# Patient Record
Sex: Female | Born: 1960 | Race: White | Hispanic: No | Smoking: Current every day smoker
Health system: Southern US, Community
[De-identification: ages and names within clinical notes are randomized; demographics above are authoritative.]

## PROBLEM LIST (undated history)

## (undated) DIAGNOSIS — M4800 Spinal stenosis, site unspecified: Secondary | ICD-10-CM

## (undated) DIAGNOSIS — M51369 Other intervertebral disc degeneration, lumbar region without mention of lumbar back pain or lower extremity pain: Secondary | ICD-10-CM

## (undated) DIAGNOSIS — I10 Essential (primary) hypertension: Secondary | ICD-10-CM

## (undated) DIAGNOSIS — J449 Chronic obstructive pulmonary disease, unspecified: Secondary | ICD-10-CM

## (undated) HISTORY — PX: ABDOMINAL HYSTERECTOMY: SHX81

---

## 2000-07-01 ENCOUNTER — Encounter: Payer: Self-pay | Admitting: Family Medicine

## 2000-07-01 ENCOUNTER — Ambulatory Visit (HOSPITAL_COMMUNITY): Admission: RE | Admit: 2000-07-01 | Discharge: 2000-07-01 | Payer: Self-pay | Admitting: Family Medicine

## 2000-07-08 ENCOUNTER — Ambulatory Visit (HOSPITAL_COMMUNITY): Admission: RE | Admit: 2000-07-08 | Discharge: 2000-07-08 | Payer: Self-pay | Admitting: Family Medicine

## 2000-07-08 ENCOUNTER — Encounter: Payer: Self-pay | Admitting: Family Medicine

## 2000-09-19 ENCOUNTER — Encounter: Payer: Self-pay | Admitting: Family Medicine

## 2000-09-19 ENCOUNTER — Ambulatory Visit (HOSPITAL_COMMUNITY): Admission: RE | Admit: 2000-09-19 | Discharge: 2000-09-19 | Payer: Self-pay | Admitting: Family Medicine

## 2002-08-31 ENCOUNTER — Encounter: Payer: Self-pay | Admitting: Family Medicine

## 2002-08-31 ENCOUNTER — Ambulatory Visit (HOSPITAL_COMMUNITY): Admission: RE | Admit: 2002-08-31 | Discharge: 2002-08-31 | Payer: Self-pay | Admitting: Family Medicine

## 2002-09-24 ENCOUNTER — Ambulatory Visit (HOSPITAL_COMMUNITY): Admission: RE | Admit: 2002-09-24 | Discharge: 2002-09-24 | Payer: Self-pay | Admitting: Family Medicine

## 2002-09-24 ENCOUNTER — Encounter: Payer: Self-pay | Admitting: Family Medicine

## 2004-08-07 ENCOUNTER — Ambulatory Visit (HOSPITAL_COMMUNITY): Admission: RE | Admit: 2004-08-07 | Discharge: 2004-08-07 | Payer: Self-pay | Admitting: Family Medicine

## 2004-08-07 ENCOUNTER — Ambulatory Visit: Payer: Self-pay | Admitting: Family Medicine

## 2004-09-14 ENCOUNTER — Ambulatory Visit: Payer: Self-pay | Admitting: Family Medicine

## 2005-01-23 ENCOUNTER — Ambulatory Visit: Payer: Self-pay | Admitting: Family Medicine

## 2005-01-24 ENCOUNTER — Ambulatory Visit (HOSPITAL_COMMUNITY): Admission: RE | Admit: 2005-01-24 | Discharge: 2005-01-24 | Payer: Self-pay | Admitting: Family Medicine

## 2005-03-28 ENCOUNTER — Ambulatory Visit: Payer: Self-pay | Admitting: Family Medicine

## 2005-09-06 ENCOUNTER — Ambulatory Visit: Payer: Self-pay | Admitting: Family Medicine

## 2005-09-24 ENCOUNTER — Ambulatory Visit: Payer: Self-pay | Admitting: Family Medicine

## 2005-09-24 ENCOUNTER — Other Ambulatory Visit: Admission: RE | Admit: 2005-09-24 | Discharge: 2005-09-24 | Payer: Self-pay | Admitting: Family Medicine

## 2006-09-16 ENCOUNTER — Ambulatory Visit: Payer: Self-pay | Admitting: Family Medicine

## 2006-12-20 ENCOUNTER — Ambulatory Visit: Payer: Self-pay | Admitting: Family Medicine

## 2007-02-11 ENCOUNTER — Ambulatory Visit: Payer: Self-pay | Admitting: Family Medicine

## 2007-06-12 ENCOUNTER — Encounter: Payer: Self-pay | Admitting: Family Medicine

## 2007-12-17 DIAGNOSIS — F17200 Nicotine dependence, unspecified, uncomplicated: Secondary | ICD-10-CM | POA: Insufficient documentation

## 2007-12-17 DIAGNOSIS — J45909 Unspecified asthma, uncomplicated: Secondary | ICD-10-CM | POA: Insufficient documentation

## 2007-12-17 DIAGNOSIS — E669 Obesity, unspecified: Secondary | ICD-10-CM | POA: Insufficient documentation

## 2007-12-17 DIAGNOSIS — F329 Major depressive disorder, single episode, unspecified: Secondary | ICD-10-CM | POA: Insufficient documentation

## 2008-11-17 ENCOUNTER — Ambulatory Visit: Payer: Self-pay | Admitting: Family Medicine

## 2008-11-17 DIAGNOSIS — N920 Excessive and frequent menstruation with regular cycle: Secondary | ICD-10-CM

## 2008-11-24 DIAGNOSIS — G4700 Insomnia, unspecified: Secondary | ICD-10-CM

## 2008-11-26 ENCOUNTER — Telehealth (INDEPENDENT_AMBULATORY_CARE_PROVIDER_SITE_OTHER): Payer: Self-pay | Admitting: *Deleted

## 2008-11-30 ENCOUNTER — Ambulatory Visit (HOSPITAL_COMMUNITY): Admission: RE | Admit: 2008-11-30 | Discharge: 2008-11-30 | Payer: Self-pay | Admitting: Family Medicine

## 2009-06-21 ENCOUNTER — Encounter: Payer: Self-pay | Admitting: Family Medicine

## 2009-06-24 ENCOUNTER — Ambulatory Visit: Payer: Self-pay | Admitting: Family Medicine

## 2009-06-24 DIAGNOSIS — M549 Dorsalgia, unspecified: Secondary | ICD-10-CM | POA: Insufficient documentation

## 2009-06-24 DIAGNOSIS — N3000 Acute cystitis without hematuria: Secondary | ICD-10-CM

## 2009-06-24 DIAGNOSIS — R109 Unspecified abdominal pain: Secondary | ICD-10-CM

## 2009-06-24 LAB — CONVERTED CEMR LAB
Bilirubin Urine: NEGATIVE
Protein, U semiquant: NEGATIVE
Urobilinogen, UA: 0.2

## 2009-06-30 ENCOUNTER — Ambulatory Visit (HOSPITAL_COMMUNITY): Admission: RE | Admit: 2009-06-30 | Discharge: 2009-06-30 | Payer: Self-pay | Admitting: Family Medicine

## 2009-06-30 ENCOUNTER — Ambulatory Visit: Payer: Self-pay | Admitting: Family Medicine

## 2009-06-30 DIAGNOSIS — I2510 Atherosclerotic heart disease of native coronary artery without angina pectoris: Secondary | ICD-10-CM | POA: Insufficient documentation

## 2009-06-30 DIAGNOSIS — IMO0002 Reserved for concepts with insufficient information to code with codable children: Secondary | ICD-10-CM

## 2009-06-30 DIAGNOSIS — R1084 Generalized abdominal pain: Secondary | ICD-10-CM | POA: Insufficient documentation

## 2009-07-04 ENCOUNTER — Ambulatory Visit (HOSPITAL_COMMUNITY): Admission: RE | Admit: 2009-07-04 | Discharge: 2009-07-04 | Payer: Self-pay | Admitting: Family Medicine

## 2009-07-11 ENCOUNTER — Encounter: Payer: Self-pay | Admitting: Family Medicine

## 2009-07-20 ENCOUNTER — Encounter (HOSPITAL_COMMUNITY): Admission: RE | Admit: 2009-07-20 | Discharge: 2009-08-19 | Payer: Self-pay | Admitting: Cardiology

## 2009-07-20 ENCOUNTER — Encounter (INDEPENDENT_AMBULATORY_CARE_PROVIDER_SITE_OTHER): Payer: Self-pay | Admitting: Cardiology

## 2009-07-26 ENCOUNTER — Encounter: Payer: Self-pay | Admitting: Family Medicine

## 2009-07-27 ENCOUNTER — Telehealth: Payer: Self-pay | Admitting: Family Medicine

## 2009-07-28 ENCOUNTER — Telehealth: Payer: Self-pay | Admitting: Family Medicine

## 2009-08-02 ENCOUNTER — Ambulatory Visit: Payer: Self-pay | Admitting: Family Medicine

## 2009-08-02 ENCOUNTER — Telehealth: Payer: Self-pay | Admitting: Family Medicine

## 2009-08-02 DIAGNOSIS — F411 Generalized anxiety disorder: Secondary | ICD-10-CM

## 2009-10-11 ENCOUNTER — Encounter: Payer: Self-pay | Admitting: Family Medicine

## 2010-04-23 ENCOUNTER — Emergency Department (HOSPITAL_COMMUNITY): Admission: EM | Admit: 2010-04-23 | Discharge: 2010-04-23 | Payer: Self-pay | Admitting: Emergency Medicine

## 2010-06-06 IMAGING — NM NM MYOCAR MULTI W/SPECT W/WALL MOTION & EF
2 series · 12 of 12 positions shown · non-contrast
Comparison: None.

CLINICAL DATA: Chest pain

MYOCARDIAL IMAGING WITH SPECT (REST AND EXERCISE)
GATED LEFT VENTRICULAR WALL MOTION STUDY
LEFT VENTRICULAR EJECTION FRACTION
TECHNIQUE: Standard myocardial SPECT imaging was performed after
resting intravenous injection of  10.38 mCi 6c-NNm Myoview.
Subsequently, exercise tolerance test was performed by the patient
under the supervision of the Cardiology staff.  At peak-stress,
31.9 mCi 6c-NNm Myoview was injected intravenously and standard
myocardial SPECT imaging was performed.  Quantitative gated imaging
was also performed to evaluate left ventricular wall motion, and
estimate left ventricular ejection fraction.

[Series 1: cs cardiac tc hi dose · 6.41mm/px · 6 of 512 frames shown]
[frame 43/512]
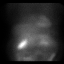
[frame 128/512]
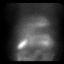
[frame 214/512]
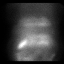
[frame 299/512]
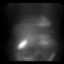
[frame 384/512]
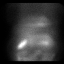
[frame 470/512]
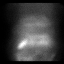

[Series 1: cr cardiac tc low dose · 6.41mm/px · 6 of 64 frames shown]
[frame 6/64]
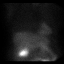
[frame 16/64]
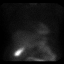
[frame 27/64]
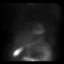
[frame 38/64]
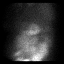
[frame 48/64]
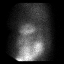
[frame 59/64]
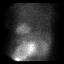

[12 of 12 positions shown; findings below may reference images not displayed]

FINDINGS: Myocardial perfusion SPECT images at stress demonstrate a subtle
perfusion defect at the anterior wall left ventricle at the apex.
Review of raw data acquisition images suggests this is a breast
attenuation artifact.
Resting exam unchanged.
Remaining myocardial perfusion appears normal.
No pulmonary uptake of tracer.

Normal left ventricular ejection fraction of 66% calculated on
gated SPECT images at stress.
This is derived from an end-diastolic volume calculation of 91 ml
and end-systolic volume of 31 ml.
Normal left ventricular wall motion identified on this gated SPECT
images at stress.
IMPRESSION: Probable breast attenuation artifact at the anterior wall left
ventricle.
No definite myocardial perfusion defects.
Normal left ventricular ejection fraction of 66%.
Normal LV wall motion.

## 2010-07-02 ENCOUNTER — Encounter: Payer: Self-pay | Admitting: Family Medicine

## 2010-07-03 ENCOUNTER — Encounter: Payer: Self-pay | Admitting: Family Medicine

## 2010-07-11 NOTE — Letter (Signed)
Summary: xray  xray   Imported By: Curtis Sites 11/04/2009 14:06:32  _____________________________________________________________________  External Attachment:    Type:   Image     Comment:   External Document

## 2010-07-11 NOTE — Letter (Signed)
Summary: misc.  misc.   Imported By: Curtis Sites 11/04/2009 14:05:01  _____________________________________________________________________  External Attachment:    Type:   Image     Comment:   External Document

## 2010-07-11 NOTE — Progress Notes (Signed)
Summary: RX  Phone Note Call from Patient   Summary of Call: SEND HER BUSPIRONE TO [facility] IN [place] Initial call taken by: Lind Guest,  August 02, 2009 1:48 PM  Follow-up for Phone Call        already did. It takes a bit to get through the system Follow-up by: Everitt Amber LPN,  August 02, 2009 1:50 PM

## 2010-07-11 NOTE — Progress Notes (Signed)
[facility]   Imported By: Lind Guest 07/14/2009 16:44:19  _____________________________________________________________________  External Attachment:    Type:   Image     Comment:   External Document

## 2010-07-11 NOTE — Progress Notes (Signed)
[facility]   Imported By: Lind Guest 10/14/2009 09:33:39  _____________________________________________________________________  External Attachment:    Type:   Image     Comment:   External Document

## 2010-07-11 NOTE — Letter (Signed)
Summary: consults  consults   Imported By: Curtis Sites 11/04/2009 14:03:14  _____________________________________________________________________  External Attachment:    Type:   Image     Comment:   External Document

## 2010-07-11 NOTE — Letter (Signed)
Summary: history and physical  history and physical   Imported By: Curtis Sites 11/04/2009 14:02:57  _____________________________________________________________________  External Attachment:    Type:   Image     Comment:   External Document

## 2010-07-11 NOTE — Letter (Signed)
Summary: labs  labs   Imported By: Curtis Sites 11/04/2009 14:04:44  _____________________________________________________________________  External Attachment:    Type:   Image     Comment:   External Document

## 2010-07-11 NOTE — Assessment & Plan Note (Signed)
Summary: office visit   Vital Signs:  Patient profile:   50 year old female Menstrual status:  irregular Height:      66 inches Weight:      248.75 pounds BMI:     40.29 O2 Sat:      97 % Pulse rate:   83 / minute Pulse rhythm:   regular Resp:     16 per minute BP sitting:   114 / 76  (right arm) Cuff size:   large  Vitals Entered By: Everitt Amber (June 30, 2009 2:05 PM)  Nutrition Counseling: Patient's BMI is greater than 25 and therefore counseled on weight management options. CC: Still having back pain, lastnight and this morning it was awful Pain Assessment Patient in pain? yes     Location: low back Intensity: 3 Type: aching Onset of pain  2 weeks   Primary Care Provider:  Syliva Overman MD  CC:  Still having back pain and lastnight and this morning it was awful.  History of Present Illness: Pt reprts that she is still having alot of pain cannot lie down at night, not sleeping.The pain is intermittent , more than constant, it radiates around the abdomen and pelvis at times. She has no other new complaints    Preventive Screening-Counseling & Management  Alcohol-Tobacco     Smoking Cessation Counseling: yes  Current Medications (verified): 1)  Lortab 10 10-500 Mg Tabs (Hydrocodone-Acetaminophen) .... One Tablet Evry 4 To 6 Hours As Needed 2)  Cyclobenzaprine Hcl 10 Mg Tabs (Cyclobenzaprine Hcl) .... Take 1 Tablet By Mouth Three Times A Day 3)  Septra Ds 800-160 Mg Tabs (Sulfamethoxazole-Trimethoprim) .... Take 1 Tablet By Mouth Two Times A Day 4)  Proventil Hfa 108 (90 Base) Mcg/act Aers (Albuterol Sulfate) .... 2 Puffs Every 6 To 8 Hours As Needed 5)  Symbicort 160-4.5 Mcg/act Aero (Budesonide-Formoterol Fumarate) .... 2 Puffs Twice Daily  Allergies (verified): 1)  ! Penicillin  Review of Systems      See HPI General:  Complains of sleep disorder; uncontrooled pain. ENT:  Denies hoarseness, nasal congestion, sinus pressure, and sore throat. CV:   Complains of palpitations; 2 month. Resp:  Denies cough and sputum productive. GI:  Complains of abdominal pain; denies constipation, diarrhea, nausea, and vomiting. GU:  Denies dysuria and urinary frequency. MS:  Complains of mid back pain; pain persits, primarily when she lies down.  Physical Exam  General:  Well-developed obese,in no cardiopulmonary distress; alert,appropriate and cooperative throughout examination. Pt in pain. HEENT: No facial asymmetry,  EOMI, No sinus tenderness, TM's Clear, oropharynx  pink and moist.   Chest:decreased air entry bilaterally scattrered wheezes, no crackles  CVS: S1, S2, No murmurs, No S3.   Abd: Soft, left renal angle tenderness. generalizedfabdominal tenderness, no guarding, rebound , organomegaly or masses palpable MS: decreased ROM thoracic spineparticularly with turnin g to the sides,adequate in  hips, shoulders and knees.  Ext: No edema.   CNS: CN 2-12 intact, power tone and sensation normal throughout.   Skin: Intact, no visible lesions or rashes.  Psych: Good eye contact, normal affect.  Memory intact,both anxious and depressed appearing.     Impression & Recommendations:  Problem # 1:  BACK PAIN WITH RADICULOPATHY (ICD-729.2) Assessment Unchanged  Orders: Ketorolac-Toradol 15mg  (Z6109) Depo- Medrol 80mg  (J1040) Admin of Therapeutic Inj  intramuscular or subcutaneous (60454) Radiology Referral (Radiology)  Problem # 2:  ABDOMINAL PAIN, GENERALIZED (ICD-789.07) Assessment: Deteriorated  Orders: Radiology Referral (Radiology)  Problem # 3:  PELVIC  PAIN (ICD-789.09) Assessment: Deteriorated  The following medications were removed from the medication list:    Cyclobenzaprine Hcl 10 Mg Tabs (Cyclobenzaprine hcl) ..... One tab three times a day    Lortab 10 10-500 Mg Tabs (Hydrocodone-acetaminophen) ..... One tab every 4-6 hrs as needed Her updated medication list for this problem includes:    Lortab 10 10-500 Mg Tabs  (Hydrocodone-acetaminophen) ..... One tablet evry 4 to 6 hours as needed    Cyclobenzaprine Hcl 10 Mg Tabs (Cyclobenzaprine hcl) .Marland Kitchen... Take 1 tablet by mouth three times a day  Orders: Radiology Referral (Radiology)  Problem # 4:  OBESITY (ICD-278.00) Assessment: Unchanged  Ht: 66 (06/30/2009)   Wt: 248.75 (06/30/2009)   BMI: 40.29 (06/30/2009)  Problem # 5:  ASTHMA, UNSPECIFIED, UNSPECIFIED STATUS (ICD-493.90) Assessment: Improved  Her updated medication list for this problem includes:    Proventil Hfa 108 (90 Base) Mcg/act Aers (Albuterol sulfate) .Marland Kitchen... 2 puffs every 6 to 8 hours as needed    Symbicort 160-4.5 Mcg/act Aero (Budesonide-formoterol fumarate) .Marland Kitchen... 2 puffs twice daily    Prednisone (pak) 5 Mg Tabs (Prednisone) ..... Uad x 6 days  Problem # 6:  NICOTINE ADDICTION (ICD-305.1) Assessment: Unchanged  Encouraged smoking cessation and discussed different methods for smoking cessation.   Complete Medication List: 1)  Lortab 10 10-500 Mg Tabs (Hydrocodone-acetaminophen) .... One tablet evry 4 to 6 hours as needed 2)  Cyclobenzaprine Hcl 10 Mg Tabs (Cyclobenzaprine hcl) .... Take 1 tablet by mouth three times a day 3)  Septra Ds 800-160 Mg Tabs (Sulfamethoxazole-trimethoprim) .... Take 1 tablet by mouth two times a day 4)  Proventil Hfa 108 (90 Base) Mcg/act Aers (Albuterol sulfate) .... 2 puffs every 6 to 8 hours as needed 5)  Symbicort 160-4.5 Mcg/act Aero (Budesonide-formoterol fumarate) .... 2 puffs twice daily 6)  Prednisone (pak) 5 Mg Tabs (Prednisone) .... Uad x 6 days  Other Orders: Cardiology Referral (Cardiology)  Patient Instructions: 1)  Please schedule a follow-up appointment in 1 month. 2)  Tobacco is very bad for your health and your loved ones! You Should stop smoking!. 3)  Stop Smoking Tips: Choose a Quit date. Cut down before the Quit date. decide what you will do as a substitute when you feel the urge to smoke(gum,toothpick,exercise). 4)  It is  important that you exercise regularly at least 20 minutes 5 times a week. If you develop chest pain, have severe difficulty breathing, or feel very tired , stop exercising immediately and seek medical attention. 5)  You need to lose weight. Consider a lower calorie diet and regular exercise.' 6)  You will be referred for MRI of thoracolumbar spine and abdominal and pelvic CT scasn with contrast to eval back pain, upper abd pain and re-eval ovasrian cyst 7)  You will be referred to cardiology Prescriptions: PREDNISONE (PAK) 5 MG TABS (PREDNISONE) UAD x 6 days  #21 x 0   Entered by:   Everitt Amber   Authorized by:   Syliva Overman MD   Signed by:   Syliva Overman MD on 07/11/2009   Method used:   Handwritten   RxID:   [id number]    Medication Administration  Injection # 1:    Medication: Depo- Medrol 80mg     Diagnosis: BACK PAIN WITH RADICULOPATHY (ICD-729.2)    Route: IM    Site: RUOQ gluteus    Exp Date: 10/12    Lot #: [id number]    Mfr: [employer]    Comments: 80 mg given   Injection #  2:    Medication: Ketorolac-Toradol 15mg     Diagnosis: BACK PAIN WITH RADICULOPATHY (ICD-729.2)    Route: IM    Site: LUOQ gluteus    Exp Date: 01/2011    Lot #: [id number]    Mfr: [email or web address]    Comments: 60 mg given     Patient tolerated injection without complications    Given by: Everitt Amber (June 30, 2009 3:29 PM)  Orders Added: 1)  Est. Patient Level III [99213] 2)  Ketorolac-Toradol 15mg  [J1885] 3)  Depo- Medrol 80mg  [J1040] 4)  Admin of Therapeutic Inj  intramuscular or subcutaneous [96372] 5)  Cardiology Referral [Cardiology] 6)  Radiology Referral [Radiology] 7)  Radiology Referral [Radiology]

## 2010-07-11 NOTE — Progress Notes (Signed)
[facility]   Imported By: Lind Guest 07/28/2009 14:37:00  _____________________________________________________________________  External Attachment:    Type:   Image     Comment:   External Document

## 2010-07-11 NOTE — Assessment & Plan Note (Signed)
Summary: office visit   Vital Signs:  Patient profile:   50 year old female Menstrual status:  irregular Height:      66 inches Weight:      242.75 pounds BMI:     39.32 O2 Sat:      96 % Pulse rate:   101 / minute Pulse rhythm:   regular Resp:     16 per minute BP sitting:   124 / 80 Cuff size:   large  Vitals Entered By: Everitt Amber LPN (August 02, 2009 12:55 PM)  Nutrition Counseling: Patient's BMI is greater than 25 and therefore counseled on weight management options. CC: Follow up chronic problems Is Patient Diabetic? No Pain Assessment Patient in pain? no        Primary Care Provider:  Syliva Overman MD  CC:  Follow up chronic problems.  History of Present Illness: Pt feels much better , she is reassured that she has no heart disease.  She continues to have significant stress as far as her marriage to an alcoholic  is concerned, and she Gina Robinson has unfortunately lot of drama and stress with 2 adult daughters who refuse to work. She  states she actually feels like running away from home now.she has no immediate urge to resme a relationship with her spouse.   Current Medications (verified): 1)  Aspir-Low 81 Mg Tbec (Aspirin) .... Take 1 Tablet By Mouth Once A Day 2)  Fish Oil 1000 Mg Caps (Omega-3 Fatty Acids) .... Take 1 Tablet By Mouth Once A Day  Allergies (verified): 1)  ! Penicillin  Review of Systems      See HPI General:  Complains of fatigue; denies chills and loss of appetite. Eyes:  Denies discharge. ENT:  Denies hoarseness, nasal congestion, and sinus pressure. CV:  Denies chest pain or discomfort, palpitations, and swelling of feet. Resp:  Complains of cough and shortness of breath; still smokes but trying to quit. GI:  Denies abdominal pain, constipation, diarrhea, nausea, and vomiting. GU:  Denies dysuria and urinary frequency. MS:  Complains of low back pain and mid back pain. Derm:  Denies rash. Neuro:  Denies headaches, seizures, and  sensation of room spinning. Psych:  Complains of anxiety, easily angered, easily tearful, irritability, mental problems, and panic attacks; denies suicidal thoughts/plans, thoughts of violence, and unusual visions or sounds. Endo:  Denies cold intolerance, excessive hunger, excessive thirst, excessive urination, heat intolerance, polyuria, and weight change. Heme:  Denies abnormal bruising and bleeding. Allergy:  Complains of seasonal allergies.  Physical Exam  General:  Well-developed,obese,in no acute distress; alert,appropriate and cooperative throughout examination HEENT: No facial asymmetry,  EOMI, No sinus tenderness, TM's Clear, oropharynx  pink and moist.   Chest: Clear to auscultation bilaterally. decreasedair entry throughout CVS: S1, S2, No murmurs, No S3.   Abd: Soft, Nontender.  MS: Adequate ROM spine, hips, shoulders and knees.  Ext: No edema.   CNS: CN 2-12 intact, power tone and sensation normal throughout.   Skin: Intact, no visible lesions or rashes.  Psych: Good eye contact, flat affect.  Memory intact,  depressed appearing.    Impression & Recommendations:  Problem # 1:  GENERALIZED ANXIETY DISORDER (ICD-300.02) Assessment Deteriorated  The following medications were removed from the medication list:    Buspirone Hcl 7.5 Mg Tabs (Buspirone hcl) .Marland Kitchen... Take 1 tablet by mouth two times a day Her updated medication list for this problem includes:    Buspar 5 Mg Tabs (Buspirone hcl) .Marland Kitchen... Take 1  tablet by mouth two times a day    Citalopram Hydrobromide 20 Mg Tabs (Citalopram hydrobromide) .Marland Kitchen... Take 1 tablet by mouth once a day  Problem # 2:  DEPRESSION (ICD-311) Assessment: Deteriorated  The following medications were removed from the medication list:    Buspirone Hcl 7.5 Mg Tabs (Buspirone hcl) .Marland Kitchen... Take 1 tablet by mouth two times a day Her updated medication list for this problem includes:    Buspar 5 Mg Tabs (Buspirone hcl) .Marland Kitchen... Take 1 tablet by mouth two  times a day    Citalopram Hydrobromide 20 Mg Tabs (Citalopram hydrobromide) .Marland Kitchen... Take 1 tablet by mouth once a day pt encouraged to join a support gp for families with alcohol addiction and drug addiction isssues , she has both  Problem # 3:  BACK PAIN (ICD-724.5) Assessment: Improved  The following medications were removed from the medication list:    Lortab 10 10-500 Mg Tabs (Hydrocodone-acetaminophen) ..... One tablet evry 4 to 6 hours as needed    Cyclobenzaprine Hcl 10 Mg Tabs (Cyclobenzaprine hcl) .Marland Kitchen... Take 1 tablet by mouth three times a day Her updated medication list for this problem includes:    Aspir-low 81 Mg Tbec (Aspirin) .Marland Kitchen... Take 1 tablet by mouth once a day  Problem # 4:  NICOTINE ADDICTION (ICD-305.1)  Problem # 5:  NICOTINE ADDICTION (ICD-305.1) Assessment: Unchanged  Encouraged smoking cessation and discussed different methods for smoking cessation. pt is actively wanting to and trying to quit  Complete Medication List: 1)  Aspir-low 81 Mg Tbec (Aspirin) .... Take 1 tablet by mouth once a day 2)  Fish Oil 1000 Mg Caps (Omega-3 fatty acids) .... Take 1 tablet by mouth once a day 3)  Buspar 5 Mg Tabs (Buspirone hcl) .... Take 1 tablet by mouth two times a day 4)  Citalopram Hydrobromide 20 Mg Tabs (Citalopram hydrobromide) .... Take 1 tablet by mouth once a day  Patient Instructions: 1)  CPE in 5 weeks 2)  It is important that you exercise regularly at least 20 minutes 5 times a week. If you develop chest pain, have severe difficulty breathing, or feel very tired , stop exercising immediately and seek medical attention. 3)  You need to lose weight. Consider a lower calorie diet and regular exercise.  Prescriptions: CITALOPRAM HYDROBROMIDE 20 MG TABS (CITALOPRAM HYDROBROMIDE) Take 1 tablet by mouth once a day  #30 x 2   Entered by:   Everitt Amber LPN   Authorized by:   Syliva Overman MD   Signed by:   Everitt Amber LPN on 16/03/9603   Method used:    Electronically to        [facility]  [address] (retail)       [address]       [place], [place]  [address]       Ph: [phone]       Fax: [phone]   RxID:   [id number] BUSPAR 5 MG TABS (BUSPIRONE HCL) Take 1 tablet by mouth two times a day  #60 x 2   Entered by:   Everitt Amber LPN   Authorized by:   Syliva Overman MD   Signed by:   Everitt Amber LPN on 04/07/2535   Method used:   Electronically to        [facility]  [address] (retail)       [address]       [place]  [place], [place]  [address]       Ph: [phone]       Fax: [phone]   RxID:   [id number] CITALOPRAM HYDROBROMIDE 20 MG TABS (CITALOPRAM HYDROBROMIDE) Take 1 tablet by mouth once a day  #30 x 2   Entered and Authorized by:   Syliva Overman MD   Signed by:   Syliva Overman MD on 08/02/2009   Method used:   Electronically to        [facility]  [address] (retail)       [address]       [place], [place]  [address]       Ph: [phone]       Fax: [phone]   RxID:   [id number] BUSPAR 5 MG TABS (BUSPIRONE HCL) Take 1 tablet by mouth two times a day  #60 x 2   Entered and Authorized by:   Syliva Overman MD   Signed by:   Syliva Overman MD on 08/02/2009   Method used:   Electronically to        [facility]  [address] (retail)       [address]       [place], [place]  [address]       Ph: [phone]       Fax: [phone]   RxID:   [id number] BUSPIRONE HCL 7.5 MG TABS (BUSPIRONE HCL) Take 1 tablet by mouth two times a day  #60 x 3   Entered and Authorized by:   Syliva Overman MD   Signed by:   Syliva Overman MD on 08/02/2009   Method used:   Electronically to        [phone][facility] (retail)       [address]       [place], [place]  [address]       Ph: [phone] or [phone]       Fax: [phone]   RxID:   [id number]

## 2010-07-11 NOTE — Progress Notes (Signed)
Summary: [facility]   Imported By: Lind Guest 06/24/2009 10:56:38  _____________________________________________________________________  External Attachment:    Type:   Image     Comment:   External Document

## 2010-07-11 NOTE — Assessment & Plan Note (Signed)
Summary: OV   Vital Signs:  Patient profile:   50 year old female Menstrual status:  irregular Height:      66 inches Weight:      251 pounds BMI:     40.66 O2 Sat:      96 % Pulse rate:   101 / minute Pulse rhythm:   regular Resp:     16 per minute BP sitting:   128 / 78 Cuff size:   large  Vitals Entered By: Everitt Amber (June 24, 2009 8:07 AM)  Nutrition Counseling: Patient's BMI is greater than 25 and therefore counseled on weight management options. CC: been having pain in the left side of her lower back since last friday Pain Assessment Patient in pain? yes     Location: left side of back  Intensity: 8 Type: pulsing Onset of pain  last friday   Primary Care Provider:  Syliva Overman MD  CC:  been having pain in the left side of her lower back since last friday.  History of Present Illness: Acute left flank and posterior lower chest pain (left) since  pasr 8 days. No specific aggravating factor noted.Pain is worse with twisting upper torso, breathing does not aggravate it specifically it is preventing her from rest. She had 2 previous MD encounters, one at a clinic , the other in the ed in [place], no imaging studies were done, pt states pain is 10 plus, she is on flexeril10mg  2 three times daily an hydrocodone 10/500 every 4 hours, had ben given tramadol  and diclofenac , chlorzoxazone with no relief States she was using initially 600mg  ibuprofen evry 3 to 4 hrs with tylenol with no relief , on the first day before she went to the clinic  She states her asthma  is about the same , currently sha has no inhalers, she has no insurance.   Preventive Screening-Counseling & Management  Alcohol-Tobacco     Smoking Status: current     Smoking Cessation Counseling: yes     Packs/Day: 1.0  Current Medications (verified): 1)  Cyclobenzaprine Hcl 10 Mg Tabs (Cyclobenzaprine Hcl) .... One Tab Three Times A Day 2)  Lortab 10 10-500 Mg Tabs (Hydrocodone-Acetaminophen)  .... One Tab Every 4-6 Hrs As Needed  Allergies (verified): 1)  ! Penicillin  Social History: Packs/Day:  1.0  Review of Systems General:  Complains of fatigue; denies chills and fever. ENT:  Denies nasal congestion, sinus pressure, and sore throat. CV:  Denies chest pain or discomfort, palpitations, and swelling of feet. Resp:  Complains of cough, shortness of breath, and wheezing; denies sputum productive; continues to smoke over 1PPD. GI:  Denies abdominal pain, constipation, diarrhea, nausea, and vomiting. GU:  Complains of urinary frequency; denies dysuria. MS:  See HPI; Denies joint pain and stiffness. Derm:  Denies itching and rash. Neuro:  Denies seizures and sensation of room spinning. Psych:  Complains of anxiety and depression. Endo:  Denies excessive thirst and excessive urination. Heme:  Denies abnormal bruising and bleeding. Allergy:  Complains of seasonal allergies; denies hives or rash and itching eyes.  Physical Exam  General:  Well-developed obese,in no cardiopulmonary distress; alert,appropriate and cooperative throughout examination. Pt in pain. HEENT: No facial asymmetry,  EOMI, No sinus tenderness, TM's Clear, oropharynx  pink and moist.   Chest:decreased air entry bilaterally scattrered wheezes, no crackles  CVS: S1, S2, No murmurs, No S3.   Abd: Soft, left renal angle tenderness.  MS: decreased ROM thoracic spineparticularly with turnin g  to the sides,adequate in  hips, shoulders and knees.  Ext: No edema.   CNS: CN 2-12 intact, power tone and sensation normal throughout.   Skin: Intact, no visible lesions or rashes.  Psych: Good eye contact, normal affect.  Memory intact,both anxious and depressed appearing.     Impression & Recommendations:  Problem # 1:  FLANK PAIN, LEFT (ICD-789.09) Assessment Deteriorated  Her updated medication list for this problem includes:    Cyclobenzaprine Hcl 10 Mg Tabs (Cyclobenzaprine hcl) ..... One tab three times a  day    Lortab 10 10-500 Mg Tabs (Hydrocodone-acetaminophen) ..... One tab every 4-6 hrs as needed    Lortab 10 10-500 Mg Tabs (Hydrocodone-acetaminophen) ..... One tablet evry 4 to 6 hours as needed    Cyclobenzaprine Hcl 10 Mg Tabs (Cyclobenzaprine hcl) .Marland Kitchen... Take 1 tablet by mouth three times a day  Orders: T-Creatinine Blood ([id number]) Radiology Referral (Radiology)  Problem # 2:  BACK PAIN (ICD-724.5) Assessment: Deteriorated  Her updated medication list for this problem includes:    Cyclobenzaprine Hcl 10 Mg Tabs (Cyclobenzaprine hcl) ..... One tab three times a day    Lortab 10 10-500 Mg Tabs (Hydrocodone-acetaminophen) ..... One tab every 4-6 hrs as needed    Lortab 10 10-500 Mg Tabs (Hydrocodone-acetaminophen) ..... One tablet evry 4 to 6 hours as needed    Cyclobenzaprine Hcl 10 Mg Tabs (Cyclobenzaprine hcl) .Marland Kitchen... Take 1 tablet by mouth three times a day  Orders: Radiology Referral (Radiology) Depo- Medrol 80mg  (J1040) Ketorolac-Toradol 15mg  (H0865) Admin of Therapeutic Inj  intramuscular or subcutaneous (78469)  Problem # 3:  OBESITY (ICD-278.00) Assessment: Unchanged  Ht: 66 (06/24/2009)   Wt: 251 (06/24/2009)   BMI: 40.66 (06/24/2009)  Problem # 4:  NICOTINE ADDICTION (ICD-305.1) Assessment: Unchanged  Encouraged smoking cessation and discussed different methods for smoking cessation.   Problem # 5:  ASTHMA, UNSPECIFIED, UNSPECIFIED STATUS (ICD-493.90) Assessment: Unchanged  Her updated medication list for this problem includes:    Proventil Hfa 108 (90 Base) Mcg/act Aers (Albuterol sulfate) .Marland Kitchen... 2 puffs every 6 to 8 hours as needed    Symbicort 160-4.5 Mcg/act Aero (Budesonide-formoterol fumarate) .Marland Kitchen... 2 puffs twice daily  Complete Medication List: 1)  Cyclobenzaprine Hcl 10 Mg Tabs (Cyclobenzaprine hcl) .... One tab three times a day 2)  Lortab 10 10-500 Mg Tabs (Hydrocodone-acetaminophen) .... One tab every 4-6 hrs as needed 3)  Lortab 10 10-500 Mg Tabs  (Hydrocodone-acetaminophen) .... One tablet evry 4 to 6 hours as needed 4)  Cyclobenzaprine Hcl 10 Mg Tabs (Cyclobenzaprine hcl) .... Take 1 tablet by mouth three times a day 5)  Septra Ds 800-160 Mg Tabs (Sulfamethoxazole-trimethoprim) .... Take 1 tablet by mouth two times a day 6)  Proventil Hfa 108 (90 Base) Mcg/act Aers (Albuterol sulfate) .... 2 puffs every 6 to 8 hours as needed 7)  Symbicort 160-4.5 Mcg/act Aero (Budesonide-formoterol fumarate) .... 2 puffs twice daily  Other Orders: UA Dipstick W/ Micro (manual) (62952)  Patient Instructions: 1)  Return for rept CCUA on completion of antibiotics in 1 week. 2)  CPE in 3 months. 3)  You need tests to see the caiuse of your pain, you eill be treated for presumed UTI to explain the blood in the urine . THIOs MUST bE RECHECKED 4)  Tobacco is very bad for your health and your loved ones! You Should stop smoking!. 5)  Stop Smoking Tips: Choose a Quit date. Cut down before the Quit date. decide what you will do as a substitute  when you feel the urge to smoke(gum,toothpick,exercise). 6)  It is important that you exercise regularly at least 20 minutes 5 times a week. If you develop chest pain, have severe difficulty breathing, or feel very tired , stop exercising immediately and seek medical attention. 7)  You need to lose weight. Consider a lower calorie diet and regular exercise.  Prescriptions: SYMBICORT 160-4.5 MCG/ACT AERO (BUDESONIDE-FORMOTEROL FUMARATE) 2 puffs twice daily  #2 x 0   Entered by:   Everitt Amber   Authorized by:   Syliva Overman MD   Signed by:   Syliva Overman MD on 06/24/2009   Method used:   Samples Given   RxID:   [id number] SYMBICORT 160-4.5 MCG/ACT AERO (BUDESONIDE-FORMOTEROL FUMARATE) 2 puffs twice daily  #1 x 3   Entered and Authorized by:   Syliva Overman MD   Signed by:   Syliva Overman MD on 06/24/2009   Method used:   Printed then faxed to ...       [facility] [address] [phone]* (retail)        [address] [place], [place]  [address]       Ph: [phone]       Fax: [phone]   RxID:   [id number] PROVENTIL HFA 108 (90 BASE) MCG/ACT AERS (ALBUTEROL SULFATE) 2 puffs every 6 to 8 hours as needed  #1 x 3   Entered and Authorized by:   Syliva Overman MD   Signed by:   Syliva Overman MD on 06/24/2009   Method used:   Printed then faxed to ...       [facility] [address] [phone]* (retail)       [address] [place], [place]  [address]       Ph: [phone]       Fax: [phone]   RxID:   [id number] SEPTRA DS 800-160 MG TABS (SULFAMETHOXAZOLE-TRIMETHOPRIM) Take 1 tablet by mouth two times a day  #20 x 0   Entered and Authorized by:   Syliva Overman MD   Signed by:   Syliva Overman MD on 06/24/2009   Method used:   Printed then faxed to ...       [facility] [address] [phone]* (retail)       [address] [place], [place]  [address]       Ph: [phone]       Fax: [phone]   RxID:   [id number] CYCLOBENZAPRINE HCL 10 MG TABS (CYCLOBENZAPRINE HCL) Take 1 tablet by mouth three times a day  #45 x 0   Entered and Authorized by:   Syliva Overman MD   Signed by:   Syliva Overman MD on 06/24/2009   Method used:   Printed then faxed to ...       [facility] [address] [phone]* (retail)       [address] [place], [place]  [address]       Ph: [phone]       Fax: [phone]   RxID:   [id number] LORTAB 10 10-500 MG TABS (HYDROCODONE-ACETAMINOPHEN) one tablet evry 4 to 6 hours as needed  #40 x 0   Entered and Authorized by:   Syliva Overman MD   Signed by:   Syliva Overman MD on 06/24/2009   Method used:   Printed then faxed to .Marland KitchenMarland Kitchen  [facility] [address] [phone]* (retail)       [address] [place], [place]  [address]       Ph: [phone]       Fax: [phone]   RxID:   [id number]   Laboratory Results   Urine Tests  Date/Time Received: June 24, 2009  Date/Time Reported: June 24, 2009    Routine Urinalysis   Color: lt. yellow Glucose: negative   (Normal Range: Negative) Bilirubin: negative   (Normal Range: Negative) Ketone: negative   (Normal Range: Negative) Spec. Gravity: >=1.030   (Normal Range: 1.003-1.035) Blood: small   (Normal Range: Negative) pH: 5.5   (Normal Range: 5.0-8.0) Protein: negative   (Normal Range: Negative) Urobilinogen: 0.2   (Normal Range: 0-1) Nitrite: negative   (Normal Range: Negative) Leukocyte Esterace: negative   (Normal Range: Negative)         Medication Administration  Injection # 1:    Medication: Depo- Medrol 80mg     Diagnosis: BACK PAIN (ICD-724.5)    Route: IM    Site: RUOQ gluteus    Exp Date: 12/2010    Lot #: [id number]    Mfr: Pharmacia    Comments: 80 mg given     Patient tolerated injection without complications    Given by: Everitt Amber (June 24, 2009 2:04 PM)  Injection # 2:    Medication: Ketorolac-Toradol 15mg     Diagnosis: BACK PAIN (ICD-724.5)    Route: IM    Site: LUOQ gluteus    Exp Date: 01/2011    Lot #: [id number]    Mfr: [email or web address]    Comments: 60 mg given     Patient tolerated injection without complications    Given by: Everitt Amber (June 24, 2009 2:05 PM)  Orders Added: 1)  Est. Patient Level III [99213] 2)  T-Creatinine Blood [44010-27253] 3)  Radiology Referral [Radiology] 4)  Radiology Referral [Radiology] 5)  UA Dipstick W/ Micro (manual) [81000] 6)  Depo- Medrol 80mg  [J1040] 7)  Ketorolac-Toradol 15mg  [J1885] 8)  Admin of Therapeutic Inj  intramuscular or subcutaneous [96372]   Appended Document: OV pls call pt and let her know it is vital she get the blood test o rdered asap so that she can have the CT scan they will need the creATININE, TO MAKE SURE HER KIDNEYS WORK WELL    Appended Document: OV patient aware

## 2010-07-11 NOTE — Letter (Signed)
Summary: rx assistance  rx assistance   Imported By: Lind Guest 06/24/2009 16:14:44  _____________________________________________________________________  External Attachment:    Type:   Image     Comment:   External Document

## 2010-07-11 NOTE — Progress Notes (Signed)
Summary: RX  Phone Note Call from Patient   Summary of Call: WENT TO THE HEART DR. 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  [facility]  CALL BACK AT [phone] TO LET HER KNOW Initial call taken by: Lind Guest,  July 27, 2009 1:36 PM  Follow-up for Phone Call        advise her to have the heart doc prescribere the med thios is ok Follow-up by: Syliva Overman MD,  July 27, 2009 4:14 PM  Additional Follow-up for Phone Call Additional follow up Details #1::        patient aware Additional Follow-up by: Adella Hare LPN,  July 27, 2009 4:19 PM     Appended Document: RX Her husband called and said that the heart doctor faxed [place] a letter stating that he was wanting her PCP to write the RX and wanted to know if you could send it to [facility] in [place]

## 2010-07-11 NOTE — Letter (Signed)
Summary: demographic  demographic   Imported By: Curtis Sites 11/04/2009 14:04:08  _____________________________________________________________________  External Attachment:    Type:   Image     Comment:   External Document

## 2010-07-11 NOTE — Letter (Signed)
Summary: phone notes  phone notes   Imported By: Curtis Sites 11/04/2009 14:05:20  _____________________________________________________________________  External Attachment:    Type:   Image     Comment:   External Document

## 2010-07-11 NOTE — Progress Notes (Signed)
Summary: rx  Phone Note Call from Patient   Summary of Call: pt calling about her medication. heart doc don't do anixety meds. [phone] Initial call taken by: Rudene Anda,  July 28, 2009 1:12 PM  Follow-up for Phone Call        needs an anxiety medication, the cardiologist will not prescribe this Follow-up by: Adella Hare LPN,  July 28, 2009 1:23 PM  Additional Follow-up for Phone Call Additional follow up Details #1::        The letter from the cardiologist states that she is to have another test done (event monitor) then they are going to see her back in 6 wks.  They should make a decision on the propranolol then. Additional Follow-up by: Esperanza Sheets PA,  July 28, 2009 4:04 PM    Additional Follow-up for Phone Call Additional follow up Details #2::    I agree pls let the pt know this Follow-up by: Syliva Overman MD,  July 28, 2009 5:14 PM  Additional Follow-up for Phone Call Additional follow up Details #3:: Details for Additional Follow-up Action Taken: husband aware Additional Follow-up by: Adella Hare LPN,  July 29, 2009 9:45 AM

## 2010-07-11 NOTE — Letter (Signed)
Summary: progress notes  progress notes   Imported By: Curtis Sites 11/04/2009 14:05:58  _____________________________________________________________________  External Attachment:    Type:   Image     Comment:   External Document

## 2010-08-27 LAB — CREATININE, SERUM: Creatinine, Ser: 0.64 mg/dL (ref 0.4–1.2)

## 2010-10-15 ENCOUNTER — Emergency Department (HOSPITAL_COMMUNITY)
Admission: EM | Admit: 2010-10-15 | Discharge: 2010-10-15 | Disposition: A | Discharge status: Home / Self Care | Payer: Self-pay | Attending: Emergency Medicine | Admitting: Emergency Medicine

## 2010-10-15 DIAGNOSIS — S058X9A Other injuries of unspecified eye and orbit, initial encounter: Secondary | ICD-10-CM | POA: Insufficient documentation

## 2010-10-15 DIAGNOSIS — J45909 Unspecified asthma, uncomplicated: Secondary | ICD-10-CM | POA: Insufficient documentation

## 2017-09-05 ENCOUNTER — Other Ambulatory Visit (HOSPITAL_COMMUNITY): Payer: Self-pay | Admitting: *Deleted

## 2017-09-05 DIAGNOSIS — R229 Localized swelling, mass and lump, unspecified: Principal | ICD-10-CM

## 2017-09-05 DIAGNOSIS — IMO0002 Reserved for concepts with insufficient information to code with codable children: Secondary | ICD-10-CM

## 2017-09-10 ENCOUNTER — Encounter (HOSPITAL_COMMUNITY): Payer: Self-pay

## 2017-09-24 ENCOUNTER — Ambulatory Visit (HOSPITAL_COMMUNITY)
Admission: RE | Admit: 2017-09-24 | Discharge: 2017-09-24 | Disposition: A | Discharge status: Home / Self Care | Payer: PRIVATE HEALTH INSURANCE | Source: Ambulatory Visit | Attending: *Deleted | Admitting: *Deleted

## 2017-09-24 ENCOUNTER — Encounter (HOSPITAL_COMMUNITY): Payer: Self-pay | Admitting: Radiology

## 2017-09-24 DIAGNOSIS — R921 Mammographic calcification found on diagnostic imaging of breast: Secondary | ICD-10-CM | POA: Diagnosis not present

## 2017-09-24 DIAGNOSIS — R229 Localized swelling, mass and lump, unspecified: Secondary | ICD-10-CM

## 2017-09-24 DIAGNOSIS — IMO0002 Reserved for concepts with insufficient information to code with codable children: Secondary | ICD-10-CM

## 2018-09-15 ENCOUNTER — Other Ambulatory Visit (HOSPITAL_COMMUNITY): Payer: Self-pay | Admitting: *Deleted

## 2018-09-15 DIAGNOSIS — R921 Mammographic calcification found on diagnostic imaging of breast: Secondary | ICD-10-CM

## 2018-09-15 DIAGNOSIS — R928 Other abnormal and inconclusive findings on diagnostic imaging of breast: Secondary | ICD-10-CM

## 2018-09-30 ENCOUNTER — Encounter (HOSPITAL_COMMUNITY): Payer: Self-pay

## 2018-09-30 ENCOUNTER — Ambulatory Visit (HOSPITAL_COMMUNITY)
Admission: RE | Admit: 2018-09-30 | Discharge: 2018-09-30 | Disposition: A | Discharge status: Home / Self Care | Payer: PRIVATE HEALTH INSURANCE | Source: Ambulatory Visit | Attending: *Deleted | Admitting: *Deleted

## 2018-09-30 ENCOUNTER — Ambulatory Visit (HOSPITAL_COMMUNITY): Admission: RE | Admit: 2018-09-30 | Payer: PRIVATE HEALTH INSURANCE | Source: Ambulatory Visit

## 2018-09-30 ENCOUNTER — Other Ambulatory Visit: Payer: Self-pay

## 2018-09-30 DIAGNOSIS — R921 Mammographic calcification found on diagnostic imaging of breast: Secondary | ICD-10-CM | POA: Insufficient documentation

## 2018-09-30 DIAGNOSIS — R928 Other abnormal and inconclusive findings on diagnostic imaging of breast: Secondary | ICD-10-CM

## 2019-06-30 ENCOUNTER — Ambulatory Visit (INDEPENDENT_AMBULATORY_CARE_PROVIDER_SITE_OTHER): Payer: 59

## 2019-06-30 ENCOUNTER — Ambulatory Visit
Admission: EM | Admit: 2019-06-30 | Discharge: 2019-06-30 | Disposition: A | Discharge status: Home / Self Care | Payer: 59

## 2019-06-30 DIAGNOSIS — S6992XA Unspecified injury of left wrist, hand and finger(s), initial encounter: Secondary | ICD-10-CM

## 2019-06-30 DIAGNOSIS — M79645 Pain in left finger(s): Secondary | ICD-10-CM

## 2019-06-30 NOTE — ED Provider Notes (Signed)
[place]   [id number] 06/30/19 Arrival Time: 0240  CC: LT finger PAIN  SUBJECTIVE: History from: patient. Gina Robinson is a 59 y.o. female complains of left little finger pain/ injury that began 3 days ago.  Slip and fall on wet floor injuring LT little finger.  Localizes the pain to the LT little finger.  Describes the pain as intermittent and throbbing in character.  Has tried OTC medications with minimal relief.  Symptoms are made worse with finger ROM.  Denies similar symptoms in the past.  Complains of associated swelling and bruising.  Denies fever, chills, erythema, weakness, numbness and tingling.  ROS: As per HPI.  All other pertinent ROS negative.     History reviewed. No pertinent past medical history. Past Surgical History:  Procedure Laterality Date  . ABDOMINAL HYSTERECTOMY     Allergies  Allergen Reactions  . Penicillins    No current facility-administered medications on file prior to encounter.   Current Outpatient Medications on File Prior to Encounter  Medication Sig Dispense Refill  . amLODipine (NORVASC) 5 MG tablet Take 5 mg by mouth daily.    Marland Kitchen gabapentin (NEURONTIN) 300 MG capsule     . hydrochlorothiazide (HYDRODIURIL) 25 MG tablet Take 25 mg by mouth daily.     Social History   Socioeconomic History  . Marital status: Married    Spouse name: Not on file  . Number of children: Not on file  . Years of education: Not on file  . Highest education level: Not on file  Occupational History  . Not on file  Tobacco Use  . Smoking status: Current Every Day Smoker    Packs/day: 1.00    Types: Cigarettes  . Smokeless tobacco: Never Used  Substance and Sexual Activity  . Alcohol use: Not Currently  . Drug use: Not Currently  . Sexual activity: Not on file  Other Topics Concern  . Not on file  Social History Narrative  . Not on file   Social Determinants of Health   Financial Resource Strain:   . Difficulty of Paying Living Expenses:  Not on file  Food Insecurity:   . Worried About Charity fundraiser in the Last Year: Not on file  . Ran Out of Food in the Last Year: Not on file  Transportation Needs:   . Lack of Transportation (Medical): Not on file  . Lack of Transportation (Non-Medical): Not on file  Physical Activity:   . Days of Exercise per Week: Not on file  . Minutes of Exercise per Session: Not on file  Stress:   . Feeling of Stress : Not on file  Social Connections:   . Frequency of Communication with Friends and Family: Not on file  . Frequency of Social Gatherings with Friends and Family: Not on file  . Attends Religious Services: Not on file  . Active Member of Clubs or Organizations: Not on file  . Attends Archivist Meetings: Not on file  . Marital Status: Not on file  Intimate Partner Violence:   . Fear of Current or Ex-Partner: Not on file  . Emotionally Abused: Not on file  . Physically Abused: Not on file  . Sexually Abused: Not on file   History reviewed. No pertinent family history.  OBJECTIVE:  Vitals:   06/30/19 1804  BP: 136/84  Pulse: 92  Resp: 18  Temp: 98.4 F (36.9 C)  SpO2: 94%    General appearance: ALERT; in no acute distress.  Head: NCAT Lungs: Normal respiratory effort CV: Radial pulse 2+ . Cap refill < 2 seconds Musculoskeletal: LT hand Inspection: Ecchymosis over plantar aspect of fifth digit Palpation: TTP over PIP joint and middle phalanx of fifth digit ROM: LROM about the finger Strength: 5/5 grip strength Skin: warm and dry Neurologic: Ambulates without difficulty; Sensation intact about the upper/ lower extremities Psychological: alert and cooperative; normal mood and affect  DIAGNOSTIC STUDIES:  DG Hand Complete Left  Result Date: 06/30/2019 CLINICAL DATA:  59 year old female with fall and trauma to the left hand. EXAM: LEFT HAND - COMPLETE 3+ VIEW COMPARISON:  None. FINDINGS: There is no acute fracture or dislocation. There is osteoarthritic  changes of the base of the thumb. The soft tissues are unremarkable. IMPRESSION: 1. No acute fracture or dislocation. 2. 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 Electronically Signed   By: Elgie Collard M.D.   On: 06/30/2019 18:26    X-rays negative for bony abnormalities including fracture, or dislocation.  No soft tissue swelling.    I have reviewed the x-rays myself and the radiologist interpretation. I am in agreement with the radiologist interpretation.     ASSESSMENT & PLAN:  1. Finger pain, left   2. Finger injury, left, initial encounter    X-ray negative for fracture or dislocation Continue conservative management of rest, ice, and elevation Buddy taped Use OTC ibuprofen and/or tylenol as needed for pain Follow up with PCP or orthopedist if symptoms persist Return or go to the ER if you have any new or worsening symptoms (fever, chills, chest pain, swelling, redness, bruising, worsening symptoms despite treatment, etc...)   Reviewed expectations re: course of current medical issues. Questions answered. Outlined signs and symptoms indicating need for more acute intervention. Patient verbalized understanding. After Visit Summary given.    Rennis Harding, PA-C 06/30/19 1835

## 2019-06-30 NOTE — ED Triage Notes (Signed)
Pt presents with injury to left 1st finger, pt slipped on wet floor on Saturday and injured finger, bruising and swelling noted

## 2019-06-30 NOTE — Discharge Instructions (Signed)
X-ray negative for fracture or dislocation Continue conservative management of rest, ice, and elevation Buddy taped Use OTC ibuprofen and/or tylenol as needed for pain Follow up with PCP or orthopedist if symptoms persist Return or go to the ER if you have any new or worsening symptoms (fever, chills, chest pain, swelling, redness, bruising, worsening symptoms despite treatment, etc...)

## 2019-09-15 ENCOUNTER — Encounter (HOSPITAL_COMMUNITY): Payer: Self-pay

## 2019-09-15 ENCOUNTER — Other Ambulatory Visit: Payer: Self-pay

## 2019-09-15 ENCOUNTER — Ambulatory Visit (HOSPITAL_COMMUNITY): Payer: 59 | Attending: Orthopedic Surgery

## 2019-09-15 DIAGNOSIS — M545 Low back pain, unspecified: Secondary | ICD-10-CM

## 2019-09-15 DIAGNOSIS — M6281 Muscle weakness (generalized): Secondary | ICD-10-CM | POA: Insufficient documentation

## 2019-09-15 DIAGNOSIS — G8929 Other chronic pain: Secondary | ICD-10-CM | POA: Diagnosis present

## 2019-09-15 DIAGNOSIS — R29898 Other symptoms and signs involving the musculoskeletal system: Secondary | ICD-10-CM | POA: Insufficient documentation

## 2019-09-15 NOTE — Therapy (Signed)
[facility] [address] [place], [place], [address] Phone: [phone]   Fax:  [phone]  Physical Therapy Evaluation  Patient Details  Name: Gina Robinson MRN: [id number] Date of Birth: [birth year] Referring Provider (PT): Venita Lick, MD   Encounter Date: 09/15/2019  PT End of Session - 09/15/19 0806    Visit Number  1    Number of Visits  12    Date for PT Re-Evaluation  10/30/19    Authorization Type  [employer]   FOTO every 5 visits   Authorization Time Period  09/15/19 to 10/30/19    Progress Note Due on Visit  12    PT Start Time  0815    PT Stop Time  0900    PT Time Calculation (min)  45 min    Activity Tolerance  Patient tolerated treatment well;Patient limited by pain    Behavior During Therapy  [facility] for tasks assessed/performed       History reviewed. No pertinent past medical history.  Past Surgical History:  Procedure Laterality Date  . ABDOMINAL HYSTERECTOMY      There were no vitals filed for this visit.   Subjective Assessment - 09/15/19 0819    Subjective  Pt reports LBP began ~4 years ago was diagnosed with spinal stenosis with 4-5 ruptured or herniated discs and that Dr Shon Baton confirmed that a few weeks ago. Pt reports by 1 o'clock back pain is so bad she is nauseas. Pt reports trying to walk daily. Pt reports pain 5/10 when she wakes, then it eases some and escalates by mid afternoon. Pt reports burning pain down front of R leg to knee, pain down to tailbone, muscle spasms mid to low back on L side. Pt reports numbness tingling down BLE, R>L down anterior legs. Pt denies leg buckling or weakness causing falls. Pt denies saddle parasthesias. Pt reports "close calls" with bowel/bladder control a 1-2 times in the last 3 months. Pt reports previous doctor told her that her spine was collapsing and suggesting putting a cage and rods in her back but she declined. Pt reports no shots for pain, only oral steroids  which didn't help. Pt reports frequent position changes throughout the day. Pt reports RLE "ache like a toothache" thorughout the night causing her to take "naps" all night. Pt reports sleeping in adjustable bed, has pillows propping her in all directs. Pt reports difficulty and increased pain with household chores. Pt reports stretching to improve pain and achiness.    Limitations  Sitting;Standing;Walking    How long can you sit comfortably?  10-30 minutes depending on time of day    How long can you stand comfortably?  10-45 minutes depending on time of day    How long can you walk comfortably?  10-45 minutes depending on time of day    Diagnostic tests  x-ray:    Patient Stated Goals  hopefully help with some of the pain I guess    Currently in Pain?  Yes    Pain Score  5     Pain Location  Back    Pain Orientation  Right;Lower    Pain Descriptors / Indicators  Burning   "hot poking knife in there"   Pain Type  Chronic pain    Pain Onset  More than a month ago    Pain Frequency  Constant    Aggravating Factors   sitting, standing, chores    Pain Relieving Factors  changing  positions    Effect of Pain on Daily Activities  limited         [facility] PT Assessment - 09/15/19 0001      Assessment   Medical Diagnosis  LBP    Referring Provider (PT)  Venita Lick, MD    Onset Date/Surgical Date  --   approximately 4 years ago   Next MD Visit  Not scheduled yet - waiting for pain management and injections    Prior Therapy  Yes, no benefit       Precautions   Precautions  None      Restrictions   Weight Bearing Restrictions  No      Balance Screen   Has the patient fallen in the past 6 months  No    Has the patient had a decrease in activity level because of a fear of falling?   No    Is the patient reluctant to leave their home because of a fear of falling?   No      Prior Function   Level of Independence  Independent    Vocation  Retired    Designer, fashion/clothing     Leisure  walks, Engineer, materials   Overall Cognitive Status  Within Functional Limits for tasks assessed      Observation/Other Assessments   Focus on Therapeutic Outcomes (FOTO)   46% limited      Sensation   Light Touch  Impaired by gross assessment    Additional Comments  decreased light touch R medial and lateral calf, no dermatome path followed      Coordination   Heel Shin Test  no deficits bilaterally      Functional Tests   Functional tests  Sit to Stand      Sit to Stand   Comments  5x STS: 15 reps, from chair, no UE assist, "a little" pain increase      ROM / Strength   AROM / PROM / Strength  Strength;AROM      AROM   Overall AROM Comments  "a little" pain in low back    AROM Assessment Site  Lumbar    Lumbar Flexion  WNL    Lumbar Extension  WNL    Lumbar - Right Side Bend  WNL    Lumbar - Left Side Bend  WNL    Lumbar - Right Rotation  25% limited    Lumbar - Left Rotation  25% limited      Strength   Strength Assessment Site  Hip;Knee;Ankle    Right Hip Flexion  3+/5    Right Hip Extension  3+/5    Right Hip ABduction  3+/5    Left Hip Flexion  4/5    Left Hip Extension  3+/5    Left Hip ABduction  4+/5    Right Knee Flexion  4/5    Right Knee Extension  4+/5    Left Knee Flexion  4/5    Left Knee Extension  4+/5    Right Ankle Dorsiflexion  5/5    Left Ankle Dorsiflexion  5/5      Flexibility   Soft Tissue Assessment /Muscle Length  yes    Hamstrings  WNL bil    Quadriceps  Thomas test: negative R    ITB  WNL bil    Piriformis  WNL bil      Palpation   Spinal mobility  difficult to fully assess secondary  to pain and guarding    SI assessment   reacreation of pain with R palpation    Palpation comment  TTP and recreation of pain symptoms at R SIJ, lateral to sacrum on R, throughout R hip, R lumbar paraspinals and with gentle PAs to L 1-3      Special Tests    Special Tests  Lumbar    Lumbar Tests  Slump Test      Slump test    Findings  Negative    Comment  bilaterally      Ambulation/Gait   Assistive device  None    Gait Pattern  Within Functional Limits    Gait Comments  observed pt ambulate into/out of clinic without deficits noted, no unsteadiness or near falls      Balance   Balance Assessed  --   no concerns at this time        Objective measurements completed on examination: See above findings.       PT Education - 09/15/19 0805    Education Details  Assessment findings, FOTO findings, POC, aquatic therapy, reviewed stretches at home that pt performs and provide relief    Person(s) Educated  Patient    Methods  Explanation;Demonstration    Comprehension  Verbalized understanding;Returned demonstration       PT Short Term Goals - 09/15/19 1005      PT SHORT TERM GOAL #1   Title  Pt will perform HEP consistently and correctly to improve pain with functional mobility.    Time  3    Period  Weeks    Status  New    Target Date  10/06/19      PT SHORT TERM GOAL #2   Title  Pt will self report 25% improvement in sleeping ability due to decreased RLE pain to improve QoL.    Time  3    Period  Weeks    Status  New        PT Long Term Goals - 09/15/19 1006      PT LONG TERM GOAL #1   Title  Pt will self report 75% improvement in sleeping ability due to decreased RLE pain to improve QoL.    Time  6    Period  Weeks    Status  New    Target Date  10/27/19      PT LONG TERM GOAL #2   Title  Pt will self report 5/10 LBP at worst with 30 minutes of walking to return to walking program with reduced pain.    Time  6    Period  Weeks    Status  New      PT LONG TERM GOAL #3   Title  Pt will improve RLE strength to equal LLE strength to improve gait, stairs, and tolerance to activity.    Time  6    Period  Weeks    Status  New      PT LONG TERM GOAL #4   Title  Pt will improve FOTO to 30% limited in daily activities to indicate significant improvement in QoL and daily functional  ability.    Time  6    Period  Weeks    Status  New             Plan - 09/15/19 1001    Clinical Impression Statement  Pt is a pleasant 59YO female with chronic LBP. Pt demonstrates weakness throughout BLE, limited lumbar rotation,  and increased pain with mobility. Pt with negative slump test and good BLE flexibility. Pt with recreation of R sided lumbar paraspinal muscle pain with gentle RLE long axis distraction, possibly due to increased muscle stretch or poor ability to relax with manual. Pt demonstrates tenderness and recreation of pain symptoms with R SIJ palpation and throughout lumbar paraspinals and entire R buttock/hip mm. Pt with increased pain with transitional movements and requiring increased time to complete. Pt has personal pool at home and would benefit from aquatic therapy to provide strengthening in decreased weight-bearing position and establish HEP for home program. Pt would benefit from skilled PT interventions to improve deficits noted, establish HEP, and improve overall QoL.    Personal Factors and Comorbidities  Past/Current Experience;Time since onset of injury/illness/exacerbation    Examination-Activity Limitations  Bend;Lift;Locomotion Level;Sit;Sleep;Stairs;Stand;Transfers    Examination-Participation Restrictions  Cleaning;Community Activity;Interpersonal Relationship;Laundry;Meal Prep;Yard Work    Conservation officer, historic buildings  Evolving/Moderate complexity    Clinical Decision Making  Moderate    Rehab Potential  Fair    PT Frequency  2x / week    PT Duration  6 weeks    PT Treatment/Interventions  ADLs/Self Care Home Management;Aquatic Therapy;Biofeedback;Cryotherapy;Electrical Stimulation;Moist Heat;DME Instruction;Gait training;Stair training;Functional mobility training;Therapeutic activities;Therapeutic exercise;Balance training;Neuromuscular re-education;Patient/family education;Orthotic Fit/Training;Manual techniques;Passive range of motion;Dry  needling;Taping;Joint Manipulations    PT Next Visit Plan  Review goals. Establish HEP. Begin core activation with isometrics and gentle ROM exercises. Use manual as able for pain control. Aquatics when schedule permits.    PT Home Exercise Plan  Eval: reviewed piriformis stretch, thomas test hip flexor stretch    Consulted and Agree with Plan of Care  Patient       Patient will benefit from skilled therapeutic intervention in order to improve the following deficits and impairments:  Cardiopulmonary status limiting activity, Decreased activity tolerance, Decreased endurance, Decreased range of motion, Decreased strength, Hypomobility, Increased muscle spasms, Impaired perceived functional ability, Impaired sensation, Improper body mechanics, Obesity, Pain  Visit Diagnosis: Chronic bilateral low back pain without sciatica  Muscle weakness (generalized)  Other symptoms and signs involving the musculoskeletal system     Problem List Patient Active Problem List   Diagnosis Date Noted  . GENERALIZED ANXIETY DISORDER 08/02/2009  . CAD 06/30/2009  . BACK PAIN WITH RADICULOPATHY 06/30/2009  . ABDOMINAL PAIN, GENERALIZED 06/30/2009  . ACUTE CYSTITIS 06/24/2009  . BACK PAIN 06/24/2009  . Abdominal pain, other specified site 06/24/2009  . INSOMNIA 11/24/2008  . MENORRHAGIA 11/17/2008  . OBESITY 12/17/2007  . NICOTINE ADDICTION 12/17/2007  . DEPRESSION 12/17/2007  . ASTHMA, UNSPECIFIED, UNSPECIFIED STATUS 12/17/2007    Domenick Bookbinder PT, DPT 09/15/19, 10:12 AM [phone]  [facility] [address] [place], [place], [address] Phone: [phone]   Fax:  [phone]  Name: Gina Robinson MRN: [id number] Date of Birth: 1961-01-06

## 2019-09-16 ENCOUNTER — Ambulatory Visit (HOSPITAL_COMMUNITY): Payer: 59 | Admitting: Physical Therapy

## 2019-09-16 ENCOUNTER — Encounter (HOSPITAL_COMMUNITY): Payer: Self-pay | Admitting: Physical Therapy

## 2019-09-16 DIAGNOSIS — M6281 Muscle weakness (generalized): Secondary | ICD-10-CM

## 2019-09-16 DIAGNOSIS — R29898 Other symptoms and signs involving the musculoskeletal system: Secondary | ICD-10-CM

## 2019-09-16 DIAGNOSIS — G8929 Other chronic pain: Secondary | ICD-10-CM

## 2019-09-16 DIAGNOSIS — M545 Low back pain, unspecified: Secondary | ICD-10-CM

## 2019-09-16 NOTE — Therapy (Signed)
[facility] [address] [place], [place], [address] Phone: [phone]   Fax:  [phone]  Physical Therapy Treatment  Patient Details  Name: Gina Robinson MRN: [id number] Date of Birth: 19-Apr-1961 Referring Provider (PT): Venita Lick, MD   Encounter Date: 09/16/2019  PT End of Session - 09/16/19 1315    Visit Number  2    Number of Visits  12    Date for PT Re-Evaluation  10/30/19    Authorization Type  [employer]   FOTO every 5 visits   Authorization Time Period  09/15/19 to 10/30/19    Progress Note Due on Visit  12    PT Start Time  1305    PT Stop Time  1350    PT Time Calculation (min)  45 min    Activity Tolerance  Patient tolerated treatment well    Behavior During Therapy  [facility] for tasks assessed/performed       History reviewed. No pertinent past medical history.  Past Surgical History:  Procedure Laterality Date  . ABDOMINAL HYSTERECTOMY      There were no vitals filed for this visit.  Subjective Assessment - 09/16/19 1313    Subjective  Patient reports no new issues since initial evaluation yesterday. Reports 3-4/10 pain in low back today, says she has done a lot today.    Limitations  Sitting;Standing;Walking    How long can you sit comfortably?  10-30 minutes depending on time of day    How long can you stand comfortably?  10-45 minutes depending on time of day    How long can you walk comfortably?  10-45 minutes depending on time of day    Diagnostic tests  x-ray:    Patient Stated Goals  hopefully help with some of the pain I guess    Currently in Pain?  Yes    Pain Score  4     Pain Location  Back    Pain Orientation  Right;Posterior;Lower    Pain Descriptors / Indicators  Aching    Pain Type  Chronic pain    Pain Onset  More than a month ago                       [facility] Adult PT Treatment/Exercise - 09/16/19 0001      Exercises   Exercises  Lumbar      Lumbar Exercises:  Stretches   Lower Trunk Rotation  5 reps;10 seconds    Prone on Elbows Stretch  2 reps;60 seconds    Piriformis Stretch  Right;Left;3 reps;30 seconds      Lumbar Exercises: Supine   Ab Set  10 reps;5 seconds    Clam  10 reps    Clam Limitations  cues for level pelvis    Bent Knee Raise  20 reps    Bridge  10 reps;5 seconds               PT Short Term Goals - 09/15/19 1005      PT SHORT TERM GOAL #1   Title  Pt will perform HEP consistently and correctly to improve pain with functional mobility.    Time  3    Period  Weeks    Status  New    Target Date  10/06/19      PT SHORT TERM GOAL #2   Title  Pt will self report 25% improvement in sleeping ability due to decreased RLE  pain to improve QoL.    Time  3    Period  Weeks    Status  New        PT Long Term Goals - 09/15/19 1006      PT LONG TERM GOAL #1   Title  Pt will self report 75% improvement in sleeping ability due to decreased RLE pain to improve QoL.    Time  6    Period  Weeks    Status  New    Target Date  10/27/19      PT LONG TERM GOAL #2   Title  Pt will self report 5/10 LBP at worst with 30 minutes of walking to return to walking program with reduced pain.    Time  6    Period  Weeks    Status  New      PT LONG TERM GOAL #3   Title  Pt will improve RLE strength to equal LLE strength to improve gait, stairs, and tolerance to activity.    Time  6    Period  Weeks    Status  New      PT LONG TERM GOAL #4   Title  Pt will improve FOTO to 30% limited in daily activities to indicate significant improvement in QoL and daily functional ability.    Time  6    Period  Weeks    Status  New            Plan - 09/16/19 1354    Clinical Impression Statement  Patient tolerated session well today. Reviewed goals, educated patient on and issued HEP. Patient educate on proper form and function with all added exercise. Patient cued on proper breathing technique with ab set and ab marching. Patient  cued on hold times for bridging and hip stretching. Trialed POE stretching, patient reported overall no effect on pain, but did note onset of tingling in RT foot, which resolved once she returned to seated position. Patient reported overall pain reduction to 1/10 post session.    Personal Factors and Comorbidities  Past/Current Experience;Time since onset of injury/illness/exacerbation    Examination-Activity Limitations  Bend;Lift;Locomotion Level;Sit;Sleep;Stairs;Stand;Transfers    Examination-Participation Restrictions  Cleaning;Community Activity;Interpersonal Relationship;Laundry;Meal Prep;Yard Work    Merchant navy officer  Evolving/Moderate complexity    Rehab Potential  Fair    PT Frequency  2x / week    PT Duration  6 weeks    PT Treatment/Interventions  ADLs/Self Care Home Management;Aquatic Therapy;Biofeedback;Cryotherapy;Electrical Stimulation;Moist Heat;DME Instruction;Gait training;Stair training;Functional mobility training;Therapeutic activities;Therapeutic exercise;Balance training;Neuromuscular re-education;Patient/family education;Orthotic Fit/Training;Manual techniques;Passive range of motion;Dry needling;Taping;Joint Manipulations    PT Next Visit Plan  Assess response to HEP. Continue to progress core and hip strengthening as tolerated. Progress to standing exercise as able, add sit to stand, palof pressing at next visit. Manuals PRN for pain and restricitons.    PT Home Exercise Plan  09/16/19: ab set, piriformis stretch, bridge, LTR    Consulted and Agree with Plan of Care  Patient       Patient will benefit from skilled therapeutic intervention in order to improve the following deficits and impairments:  Cardiopulmonary status limiting activity, Decreased activity tolerance, Decreased endurance, Decreased range of motion, Decreased strength, Hypomobility, Increased muscle spasms, Impaired perceived functional ability, Impaired sensation, Improper body mechanics,  Obesity, Pain  Visit Diagnosis: Chronic bilateral low back pain without sciatica  Muscle weakness (generalized)  Other symptoms and signs involving the musculoskeletal system     Problem List Patient  Active Problem List   Diagnosis Date Noted  . GENERALIZED ANXIETY DISORDER 08/02/2009  . CAD 06/30/2009  . BACK PAIN WITH RADICULOPATHY 06/30/2009  . ABDOMINAL PAIN, GENERALIZED 06/30/2009  . ACUTE CYSTITIS 06/24/2009  . BACK PAIN 06/24/2009  . Abdominal pain, other specified site 06/24/2009  . INSOMNIA 11/24/2008  . MENORRHAGIA 11/17/2008  . OBESITY 12/17/2007  . NICOTINE ADDICTION 12/17/2007  . DEPRESSION 12/17/2007  . ASTHMA, UNSPECIFIED, UNSPECIFIED STATUS 12/17/2007   2:00 PM, 09/16/19 Georges Lynch PT DPT  Physical Therapist with [facility]  [facility]  [phone]   [facility] [address] [place], [place], [address] Phone: [phone]   Fax:  [phone]  Name: Gina Robinson MRN: [id number] Date of Birth: Sep 18, 1960

## 2019-09-16 NOTE — Patient Instructions (Signed)
Access Code: [email or web address] URL: [email or web address] Date: 09/16/2019 Prepared by: Georges Lynch  Exercises Supine Transversus Abdominis Bracing - Hands on Stomach - 2 x daily - 7 x weekly - 2 sets - 10 reps - 5 hold Supine Piriformis Stretch with Foot on Ground - 2 x daily - 7 x weekly - 1 sets - 3 reps - 30 hold Supine Bridge - 2 x daily - 7 x weekly - 2 sets - 10 reps - 5 hold Supine Lower Trunk Rotation - 2 x daily - 7 x weekly - 1 sets - 10 reps - 5 hold

## 2019-09-22 ENCOUNTER — Ambulatory Visit (HOSPITAL_COMMUNITY): Payer: 59

## 2019-09-22 ENCOUNTER — Encounter (HOSPITAL_COMMUNITY): Payer: Self-pay

## 2019-09-22 ENCOUNTER — Other Ambulatory Visit: Payer: Self-pay

## 2019-09-22 DIAGNOSIS — M6281 Muscle weakness (generalized): Secondary | ICD-10-CM

## 2019-09-22 DIAGNOSIS — G8929 Other chronic pain: Secondary | ICD-10-CM

## 2019-09-22 DIAGNOSIS — R29898 Other symptoms and signs involving the musculoskeletal system: Secondary | ICD-10-CM

## 2019-09-22 DIAGNOSIS — M545 Low back pain, unspecified: Secondary | ICD-10-CM

## 2019-09-22 NOTE — Therapy (Signed)
[facility] [address] [place], [place], [address] Phone: [phone]   Fax:  [phone]  Physical Therapy Treatment  Patient Details  Name: Gina Robinson MRN: [id number] Date of Birth: [birth year] Referring Provider (PT): Venita Lick, MD   Encounter Date: 09/22/2019  PT End of Session - 09/22/19 1428    Visit Number  3    Number of Visits  12    Date for PT Re-Evaluation  10/30/19    Authorization Type  [employer]: FOTO every 5 visits    Authorization Time Period  09/15/19 to 10/30/19    Progress Note Due on Visit  12    PT Start Time  1136    PT Stop Time  1220    PT Time Calculation (min)  44 min    Activity Tolerance  Patient tolerated treatment well    Behavior During Therapy  [facility] for tasks assessed/performed       History reviewed. No pertinent past medical history.  Past Surgical History:  Procedure Laterality Date  . ABDOMINAL HYSTERECTOMY      There were no vitals filed for this visit.  Subjective Assessment - 09/22/19 1315    Subjective  Pt reports she watched her almost 68 month year old grandson for 5 reps with increased pain, reports MHP helps temporarily.  Reports she has completed some of the HEPs, not as much as recommended.  Does not have any questions concerning the HEP.  Pain is minimal today    Patient Stated Goals  hopefully help with some of the pain I guess    Currently in Pain?  Yes    Pain Score  1     Pain Location  Back    Pain Orientation  Lower;Right;Posterior    Pain Descriptors / Indicators  Aching    Pain Type  Chronic pain    Pain Onset  More than a month ago    Pain Frequency  Constant    Aggravating Factors   sitting, standing, chores    Pain Relieving Factors  changing positions    Effect of Pain on Daily Activities  limited                       [facility] Adult PT Treatment/Exercise - 09/22/19 0001      Exercises   Exercises  Lumbar      Lumbar Exercises:  Stretches   Active Hamstring Stretch  2 reps;30 seconds    Active Hamstring Stretch Limitations  supine wiht hands behind knees    Lower Trunk Rotation  5 reps;10 seconds    Prone on Elbows Stretch  2 reps;60 seconds    Figure 4 Stretch  2 reps;30 seconds    Figure 4 Stretch Limitations  supine figure 4      Lumbar Exercises: Standing   Other Standing Lumbar Exercises  Tandem stance solid surface paloff with RTB 10x each direction wiht ab set      Lumbar Exercises: Seated   Sit to Stand  10 reps    Sit to Stand Limitations  slow and control, no HHA      Lumbar Exercises: Supine   Pelvic Tilt  10 reps;5 seconds    Pelvic Tilt Limitations  posterior    Bent Knee Raise  10 reps;5 seconds    Bent Knee Raise Limitations  with ab set; slow and controlled    Bridge  10 reps;5 seconds    Bridge  Limitations  2 sets      Manual Therapy   Manual Therapy  Soft tissue mobilization    Manual therapy comments  Manual complete separate than rest of tx    Soft tissue mobilization  Prone: STM to erector spinae and QL; increased tenderness iwht palpation over Rt PSIS               PT Short Term Goals - 09/15/19 1005      PT SHORT TERM GOAL #1   Title  Pt will perform HEP consistently and correctly to improve pain with functional mobility.    Time  3    Period  Weeks    Status  New    Target Date  10/06/19      PT SHORT TERM GOAL #2   Title  Pt will self report 25% improvement in sleeping ability due to decreased RLE pain to improve QoL.    Time  3    Period  Weeks    Status  New        PT Long Term Goals - 09/15/19 1006      PT LONG TERM GOAL #1   Title  Pt will self report 75% improvement in sleeping ability due to decreased RLE pain to improve QoL.    Time  6    Period  Weeks    Status  New    Target Date  10/27/19      PT LONG TERM GOAL #2   Title  Pt will self report 5/10 LBP at worst with 30 minutes of walking to return to walking program with reduced pain.     Time  6    Period  Weeks    Status  New      PT LONG TERM GOAL #3   Title  Pt will improve RLE strength to equal LLE strength to improve gait, stairs, and tolerance to activity.    Time  6    Period  Weeks    Status  New      PT LONG TERM GOAL #4   Title  Pt will improve FOTO to 30% limited in daily activities to indicate significant improvement in QoL and daily functional ability.    Time  6    Period  Weeks    Status  New            Plan - 09/22/19 1432    Clinical Impression Statement  Session focus on core and proximal strengthening.  Pt required some cueing for abdominal activation prior movement for stability, improved mechanics with reps.  Added mobility stretches with reports of relief following hamstring stretch and reports of pain resolved wiht posterior pelvic tilt.  Pt educated on importance of core strengthening for lumbar support with verbalized understanding.  Added STS for proximal strengthening with good control and paloff for core strengthening.  EOS with manual STM to resolve spasms Rt lumber region, reports of discomfort with palpation over Rt PSIS.  EOS reports of pain reduced.    Personal Factors and Comorbidities  Past/Current Experience;Time since onset of injury/illness/exacerbation    Examination-Activity Limitations  Bend;Lift;Locomotion Level;Sit;Sleep;Stairs;Stand;Transfers    Examination-Participation Restrictions  Cleaning;Community Activity;Interpersonal Relationship;Laundry;Meal Prep;Yard Work    [employer]chant [employer] officer  Evolving/Moderate complexity    Clinical Decision Making  Moderate    Rehab Potential  Fair    PT Frequency  2x / week    PT Duration  6 weeks    PT Treatment/Interventions  ADLs/Self  Care Home Management;Aquatic Therapy;Biofeedback;Cryotherapy;Electrical Stimulation;Moist Heat;DME Instruction;Gait training;Stair training;Functional mobility training;Therapeutic activities;Therapeutic exercise;Balance training;Neuromuscular  re-education;Patient/family education;Orthotic Fit/Training;Manual techniques;Passive range of motion;Dry needling;Taping;Joint Manipulations    PT Next Visit Plan  Assess response and compliance to HEP. Continue to progress core and hip strengthening as tolerated. Add postural strengthening, squats and lunges PRN.  Manuals PRN for pain and restricitons.    PT Home Exercise Plan  09/16/19: ab set, piriformis stretch, bridge, LTR; 4/13: supine hs stretch       Patient will benefit from skilled therapeutic intervention in order to improve the following deficits and impairments:  Cardiopulmonary status limiting activity, Decreased activity tolerance, Decreased endurance, Decreased range of motion, Decreased strength, Hypomobility, Increased muscle spasms, Impaired perceived functional ability, Impaired sensation, Improper body mechanics, Obesity, Pain  Visit Diagnosis: Muscle weakness (generalized)  Other symptoms and signs involving the musculoskeletal system  Chronic bilateral low back pain without sciatica     Problem List Patient Active Problem List   Diagnosis Date Noted  . GENERALIZED ANXIETY DISORDER 08/02/2009  . CAD 06/30/2009  . BACK PAIN WITH RADICULOPATHY 06/30/2009  . ABDOMINAL PAIN, GENERALIZED 06/30/2009  . ACUTE CYSTITIS 06/24/2009  . BACK PAIN 06/24/2009  . Abdominal pain, other specified site 06/24/2009  . INSOMNIA 11/24/2008  . MENORRHAGIA 11/17/2008  . OBESITY 12/17/2007  . NICOTINE ADDICTION 12/17/2007  . DEPRESSION 12/17/2007  . ASTHMA, UNSPECIFIED, UNSPECIFIED STATUS 12/17/2007   Becky Sax, LPTA/CLT; CBIS [phone]  Juel Burrow 09/22/2019, 2:42 PM  [place] [address] [place], [place], [address] Phone: [phone]   Fax:  [phone]  Name: Gina Robinson MRN: [id number] Date of Birth: Sep 18, 1960

## 2019-09-22 NOTE — Patient Instructions (Signed)
Bridge    Lie back, legs bent. Inhale, pressing hips up. Keeping ribs in, lengthen lower back. Exhale, rolling down along spine from top. Repeat 10 times. Do 1-2 sessions per day.  3-4 days per week.  http://pm.exer.us/55   Copyright  VHI. All rights reserved.   Hamstring Stretch    With other leg bent, foot flat, grasp right leg and slowly try to straighten knee. Hold 30 seconds. Repeat 3 times. Do 1-2 sessions per day.  http://gt2.exer.us/280   Copyright  VHI. All rights reserved.

## 2019-09-24 ENCOUNTER — Other Ambulatory Visit: Payer: Self-pay

## 2019-09-24 ENCOUNTER — Ambulatory Visit (HOSPITAL_COMMUNITY): Payer: 59 | Admitting: Physical Therapy

## 2019-09-24 ENCOUNTER — Encounter (HOSPITAL_COMMUNITY): Payer: Self-pay | Admitting: Physical Therapy

## 2019-09-24 DIAGNOSIS — M545 Low back pain: Secondary | ICD-10-CM | POA: Diagnosis not present

## 2019-09-24 DIAGNOSIS — G8929 Other chronic pain: Secondary | ICD-10-CM

## 2019-09-24 DIAGNOSIS — R29898 Other symptoms and signs involving the musculoskeletal system: Secondary | ICD-10-CM

## 2019-09-24 DIAGNOSIS — M6281 Muscle weakness (generalized): Secondary | ICD-10-CM

## 2019-09-24 NOTE — Therapy (Signed)
[facility] [address] [place], [place], [address] Phone: [phone]   Fax:  [phone]  Physical Therapy Treatment  Patient Details  Name: Gina Robinson MRN: [id number] Date of Birth: 01/15/1961 Referring Provider (PT): Venita Lick, MD   Encounter Date: 09/24/2019  PT End of Session - 09/24/19 1714    Visit Number  4    Number of Visits  12    Date for PT Re-Evaluation  10/30/19    Authorization Type  [employer]: FOTO every 5 visits    Authorization Time Period  09/15/19 to 10/30/19    Progress Note Due on Visit  12    PT Start Time  1600    PT Stop Time  1645    PT Time Calculation (min)  45 min    Activity Tolerance  Patient tolerated treatment well    Behavior During Therapy  [facility] for tasks assessed/performed       History reviewed. No pertinent past medical history.  Past Surgical History:  Procedure Laterality Date  . ABDOMINAL HYSTERECTOMY      There were no vitals filed for this visit.  Subjective Assessment - 09/24/19 1711    Subjective  Patient says therapy has been going well but notes that her back is sore today. Says she babysat her grandchild yesterday and not sure if this may have contributed.    Patient Stated Goals  hopefully help with some of the pain I guess    Currently in Pain?  Yes    Pain Score  5     Pain Location  Back    Pain Orientation  Right;Lower;Posterior    Pain Descriptors / Indicators  Burning;Aching    Pain Type  Chronic pain    Pain Onset  More than a month ago                   Adult Aquatic Therapy - 09/24/19 1718      Treatment   Gait  dynamic warmup: walking, high knee march, straight leg march, sidestepping 30' 3 RT each    Exercises  Ab set at wall 10 x 5", pelvic tilt at wall 10x5", noodle push pull x20, heel raise x20, squats at wall x20, single arm DB press down x10 each, double arm DB press down x10                 PT Education - 09/24/19  1713    Education Details  On benefit of aquatic therapy, exercises and technique    Person(s) Educated  Patient    Methods  Explanation    Comprehension  Verbalized understanding       PT Short Term Goals - 09/15/19 1005      PT SHORT TERM GOAL #1   Title  Pt will perform HEP consistently and correctly to improve pain with functional mobility.    Time  3    Period  Weeks    Status  New    Target Date  10/06/19      PT SHORT TERM GOAL #2   Title  Pt will self report 25% improvement in sleeping ability due to decreased RLE pain to improve QoL.    Time  3    Period  Weeks    Status  New        PT Long Term Goals - 09/15/19 1006      PT LONG TERM GOAL #1   Title  Pt will self report 75% improvement in sleeping ability due to decreased RLE pain to improve QoL.    Time  6    Period  Weeks    Status  New    Target Date  10/27/19      PT LONG TERM GOAL #2   Title  Pt will self report 5/10 LBP at worst with 30 minutes of walking to return to walking program with reduced pain.    Time  6    Period  Weeks    Status  New      PT LONG TERM GOAL #3   Title  Pt will improve RLE strength to equal LLE strength to improve gait, stairs, and tolerance to activity.    Time  6    Period  Weeks    Status  New      PT LONG TERM GOAL #4   Title  Pt will improve FOTO to 30% limited in daily activities to indicate significant improvement in QoL and daily functional ability.    Time  6    Period  Weeks    Status  New            Plan - 09/24/19 1714    Clinical Impression Statement  Patient tolerated session well today. Started session with patient education on benefit of aquatic therapy and answered all patient questions. Performed dynamic warm up and progressed to posture control and deep core activation. Patient able to progress core work to core stabilization with UE movement. Patient cued on TA activation during core exercise and squatting for improved lumbar stabilization.  Patient noted decreased pain post treatment.    Personal Factors and Comorbidities  Past/Current Experience;Time since onset of injury/illness/exacerbation    Examination-Activity Limitations  Bend;Lift;Locomotion Level;Sit;Sleep;Stairs;Stand;Transfers    Examination-Participation Restrictions  Cleaning;Community Activity;Interpersonal Relationship;Laundry;Meal Prep;Yard Work    [employer]chant [employer] officer  Evolving/Moderate complexity    Rehab Potential  Fair    PT Frequency  2x / week    PT Duration  6 weeks    PT Treatment/Interventions  ADLs/Self Care Home Management;Aquatic Therapy;Biofeedback;Cryotherapy;Electrical Stimulation;Moist Heat;DME Instruction;Gait training;Stair training;Functional mobility training;Therapeutic activities;Therapeutic exercise;Balance training;Neuromuscular re-education;Patient/family education;Orthotic Fit/Training;Manual techniques;Passive range of motion;Dry needling;Taping;Joint Manipulations    PT Next Visit Plan  FOTO next visit. Continue to progress core and hip strengthening as tolerated. Add postural strengthening, squats and lunges PRN.  Manuals PRN for pain and restricitons.    PT Home Exercise Plan  09/16/19: ab set, piriformis stretch, bridge, LTR; 4/13: supine hs stretch       Patient will benefit from skilled therapeutic intervention in order to improve the following deficits and impairments:  Cardiopulmonary status limiting activity, Decreased activity tolerance, Decreased endurance, Decreased range of motion, Decreased strength, Hypomobility, Increased muscle spasms, Impaired perceived functional ability, Impaired sensation, Improper body mechanics, Obesity, Pain  Visit Diagnosis: Muscle weakness (generalized)  Other symptoms and signs involving the musculoskeletal system  Chronic bilateral low back pain without sciatica     Problem List Patient Active Problem List   Diagnosis Date Noted  . GENERALIZED ANXIETY DISORDER 08/02/2009  .  CAD 06/30/2009  . BACK PAIN WITH RADICULOPATHY 06/30/2009  . ABDOMINAL PAIN, GENERALIZED 06/30/2009  . ACUTE CYSTITIS 06/24/2009  . BACK PAIN 06/24/2009  . Abdominal pain, other specified site 06/24/2009  . INSOMNIA 11/24/2008  . MENORRHAGIA 11/17/2008  . OBESITY 12/17/2007  . NICOTINE ADDICTION 12/17/2007  . DEPRESSION 12/17/2007  . ASTHMA, UNSPECIFIED, UNSPECIFIED STATUS 12/17/2007    5:22 PM, 09/24/19  Georges Lynch PT DPT  Physical Therapist with [facility]  [facility]  [phone]   [facility] [address] [place], [place], [address] Phone: [phone]   Fax:  [phone]  Name: Gina Robinson MRN: [id number] Date of Birth: 04-03-1961

## 2019-09-29 ENCOUNTER — Other Ambulatory Visit: Payer: Self-pay

## 2019-09-29 ENCOUNTER — Ambulatory Visit (HOSPITAL_COMMUNITY): Payer: 59

## 2019-09-29 ENCOUNTER — Encounter (HOSPITAL_COMMUNITY): Payer: Self-pay

## 2019-09-29 DIAGNOSIS — M545 Low back pain, unspecified: Secondary | ICD-10-CM

## 2019-09-29 DIAGNOSIS — G8929 Other chronic pain: Secondary | ICD-10-CM

## 2019-09-29 DIAGNOSIS — R29898 Other symptoms and signs involving the musculoskeletal system: Secondary | ICD-10-CM

## 2019-09-29 DIAGNOSIS — M6281 Muscle weakness (generalized): Secondary | ICD-10-CM

## 2019-09-29 NOTE — Therapy (Signed)
[place], [place], [address] Phone: [phone]   Fax:  [phone]  Physical Therapy Treatment  Patient Details  Name: Gina Robinson MRN: [id number] Date of Birth: [birth year] Referring Provider (PT): Melina Schools, MD   Encounter Date: 09/29/2019  PT End of Session - 09/29/19 1133    Visit Number  5    Number of Visits  12    Date for PT Re-Evaluation  10/30/19    Authorization Type  [employer]: [place] every 5 visits    Authorization - Number of Visits  30    Progress Note Due on Visit  12    PT Start Time  1128    PT Stop Time  1212    PT Time Calculation (min)  44 min    Activity Tolerance  Patient tolerated treatment well    Behavior During Therapy  [facility] for tasks assessed/performed       History reviewed. No pertinent past medical history.  Past Surgical History:  Procedure Laterality Date  . ABDOMINAL HYSTERECTOMY      There were no vitals filed for this visit.  Subjective Assessment - 09/29/19 1131    Subjective  Pt reports pain is minimal in the morning, reoprts increased back pain and Rt leg pain "like bad tooth ache" late at night.  Limits ability to sleep at night.  Reports compliance with HEP.    Patient Stated Goals  hopefully help with some of the pain I guess    Currently in Pain?  Yes    Pain Score  1     Pain Location  Back    Pain Orientation  Left;Lower    Pain Descriptors / Indicators  Burning    Pain Type  Chronic pain    Pain Onset  More than a month ago    Pain Frequency  Intermittent    Aggravating Factors   sitting, standing, chores    Pain Relieving Factors  changing positions    Effect of Pain on Daily Activities  limited         [facility] PT Assessment - 09/29/19 0001      Assessment   Medical Diagnosis  LBP    Referring Provider (PT)  Melina Schools, MD    Onset Date/Surgical Date  --   ~4 years ago   Next MD Visit  pain MD 10/12/19    Prior Therapy  Yes, no  benefit       Precautions   Precautions  None                   [facility] Adult PT Treatment/Exercise - 09/29/19 0001      Exercises   Exercises  Lumbar      Lumbar Exercises: Standing   Functional Squats  10 reps    Functional Squats Limitations  front of chair, cueing for mechanics    Forward Lunge  10 reps    Forward Lunge Limitations  BLE on 4in step    Row  10 reps;Theraband    Theraband Level (Row)  Level 2 (Red)    Row Limitations  2 sets    Shoulder Extension  10 reps;Theraband    Theraband Level (Shoulder Extension)  Level 2 (Red)    Shoulder Extension Limitations  2sets    Other Standing Lumbar Exercises  Tandem stance solid surface paloff with RTB 10x each direction wiht ab set    Other Standing Lumbar Exercises  SLS Lt32", Rt25"      Lumbar Exercises: Seated   Sit to Stand  10 reps    Sit to Stand Limitations  slow and control, no HHA    Other Seated Lumbar Exercises  Educated importance of posture; Posterior shoulder rolls slowly 10x    Other Seated Lumbar Exercises  cervical (cueing for form) and scapular retraction 10x 3"             PT Education - 09/29/19 1224    Education Details  Educated importance of posture    Person(s) Educated  Patient    Methods  Explanation    Comprehension  Verbalized understanding       PT Short Term Goals - 09/15/19 1005      PT SHORT TERM GOAL #1   Title  Pt will perform HEP consistently and correctly to improve pain with functional mobility.    Time  3    Period  Weeks    Status  New    Target Date  10/06/19      PT SHORT TERM GOAL #2   Title  Pt will self report 25% improvement in sleeping ability due to decreased RLE pain to improve QoL.    Time  3    Period  Weeks    Status  New        PT Long Term Goals - 09/15/19 1006      PT LONG TERM GOAL #1   Title  Pt will self report 75% improvement in sleeping ability due to decreased RLE pain to improve QoL.    Time  6    Period  Weeks    Status   New    Target Date  10/27/19      PT LONG TERM GOAL #2   Title  Pt will self report 5/10 LBP at worst with 30 minutes of walking to return to walking program with reduced pain.    Time  6    Period  Weeks    Status  New      PT LONG TERM GOAL #3   Title  Pt will improve RLE strength to equal LLE strength to improve gait, stairs, and tolerance to activity.    Time  6    Period  Weeks    Status  New      PT LONG TERM GOAL #4   Title  Pt will improve FOTO to 30% limited in daily activities to indicate significant improvement in QoL and daily functional ability.    Time  6    Period  Weeks    Status  New            Plan - 09/29/19 1218    Clinical Impression Statement  FOTO complete with improved self perceived functional abilities noted by reduced limitations.  Session focus on education for importance of posture to reduce LBP and proximal strengthening with additional exercises.  Pt able to complete all exericses correctly following demonstration and cueing for mechanics.  No reoprts of increased pain at EOS.    Personal Factors and Comorbidities  Past/Current Experience;Time since onset of injury/illness/exacerbation    Examination-Activity Limitations  Bend;Lift;Locomotion Level;Sit;Sleep;Stairs;Stand;Transfers    Examination-Participation Restrictions  Cleaning;Community Activity;Interpersonal Relationship;Laundry;Meal Prep;Yard Work    Conservation officer, historic buildings  Evolving/Moderate complexity    Clinical Decision Making  Moderate    Rehab Potential  Fair    PT Frequency  2x / week    PT Duration  6 weeks  PT Treatment/Interventions  ADLs/Self Care Home Management;Aquatic Therapy;Biofeedback;Cryotherapy;Electrical Stimulation;Moist Heat;DME Instruction;Gait training;Stair training;Functional mobility training;Therapeutic activities;Therapeutic exercise;Balance training;Neuromuscular re-education;Patient/family education;Orthotic Fit/Training;Manual techniques;Passive  range of motion;Dry needling;Taping;Joint Manipulations    PT Next Visit Plan  Continue to progress core and hip strengthening as tolerated.  Add foam with paloff exercises.  If pt able to perform correct form add theraband with postural strengthening. Continue with squats and lunges, reduce step height as able.  Add vector stance when ready.  Manuals PRN for pain and restricitons.    PT Home Exercise Plan  09/16/19: ab set, piriformis stretch, bridge, LTR; 4/13: supine hs stretch       Patient will benefit from skilled therapeutic intervention in order to improve the following deficits and impairments:  Cardiopulmonary status limiting activity, Decreased activity tolerance, Decreased endurance, Decreased range of motion, Decreased strength, Hypomobility, Increased muscle spasms, Impaired perceived functional ability, Impaired sensation, Improper body mechanics, Obesity, Pain  Visit Diagnosis: Other symptoms and signs involving the musculoskeletal system  Chronic bilateral low back pain without sciatica  Muscle weakness (generalized)     Problem List Patient Active Problem List   Diagnosis Date Noted  . GENERALIZED ANXIETY DISORDER 08/02/2009  . CAD 06/30/2009  . BACK PAIN WITH RADICULOPATHY 06/30/2009  . ABDOMINAL PAIN, GENERALIZED 06/30/2009  . ACUTE CYSTITIS 06/24/2009  . BACK PAIN 06/24/2009  . Abdominal pain, other specified site 06/24/2009  . INSOMNIA 11/24/2008  . MENORRHAGIA 11/17/2008  . OBESITY 12/17/2007  . NICOTINE ADDICTION 12/17/2007  . DEPRESSION 12/17/2007  . ASTHMA, UNSPECIFIED, UNSPECIFIED STATUS 12/17/2007   Becky Sax, LPTA/CLT; CBIS [phone]  Juel Burrow 09/29/2019, 12:26 PM  [place] [facility] [address] [place], [place], [address] Phone: [phone]   Fax:  [phone]  Name: Gina Robinson MRN: [id number] Date of Birth: September 19, 1960

## 2019-09-30 ENCOUNTER — Ambulatory Visit (HOSPITAL_COMMUNITY): Payer: 59 | Admitting: Physical Therapy

## 2019-10-01 ENCOUNTER — Ambulatory Visit (HOSPITAL_COMMUNITY): Payer: 59 | Admitting: Physical Therapy

## 2019-10-06 ENCOUNTER — Ambulatory Visit (HOSPITAL_COMMUNITY): Payer: 59

## 2019-10-06 ENCOUNTER — Encounter (HOSPITAL_COMMUNITY): Payer: Self-pay

## 2019-10-06 ENCOUNTER — Other Ambulatory Visit: Payer: Self-pay

## 2019-10-06 DIAGNOSIS — G8929 Other chronic pain: Secondary | ICD-10-CM

## 2019-10-06 DIAGNOSIS — M545 Low back pain: Secondary | ICD-10-CM | POA: Diagnosis not present

## 2019-10-06 DIAGNOSIS — M6281 Muscle weakness (generalized): Secondary | ICD-10-CM

## 2019-10-06 DIAGNOSIS — R29898 Other symptoms and signs involving the musculoskeletal system: Secondary | ICD-10-CM

## 2019-10-06 NOTE — Therapy (Signed)
[facility] [address] [place], [place], [address] Phone: [phone]   Fax:  [phone]  Physical Therapy Treatment  Patient Details  Name: Gina Robinson MRN: [id number] Date of Birth: 08/19/1960 Referring Provider (PT): Venita Lick, MD   Encounter Date: 10/06/2019  PT End of Session - 10/06/19 1030    Visit Number  6    Number of Visits  12    Date for PT Re-Evaluation  10/30/19    Authorization Type  [employer]: FOTO every 5 visits    Authorization - Number of Visits  30    Progress Note Due on Visit  12    PT Start Time  1032    PT Stop Time  1111    PT Time Calculation (min)  39 min    Activity Tolerance  Patient tolerated treatment well;Patient limited by pain    Behavior During Therapy  [facility] for tasks assessed/performed       History reviewed. No pertinent past medical history.  Past Surgical History:  Procedure Laterality Date  . ABDOMINAL HYSTERECTOMY      There were no vitals filed for this visit.  Subjective Assessment - 10/06/19 1035    Subjective  Pt reports LBP 4/10 and not going down into RLE today. Pt reports RLE aching and preventing sleep last night. Pt reports Dr. Shon Baton referred her to another doctor (pain management) and they are recommending an injection in her low back to alleviate pain. Pt reports still has good and bad days since starting therapy.    Limitations  Sitting;Standing;Walking    How long can you sit comfortably?  10-30 minutes depending on time of day    How long can you stand comfortably?  10-45 minutes depending on time of day    How long can you walk comfortably?  10-45 minutes depending on time of day    Patient Stated Goals  hopefully help with some of the pain I guess    Currently in Pain?  Yes    Pain Location  Back    Pain Orientation  Lower    Pain Descriptors / Indicators  Aching    Pain Type  Chronic pain    Pain Onset  More than a month ago    Aggravating Factors    sitting, standing, chores    Pain Relieving Factors  changing positions    Effect of Pain on Daily Activities  limited            [facility] Adult PT Treatment/Exercise - 10/06/19 0001      Lumbar Exercises: Stretches   Active Hamstring Stretch  2 reps;30 seconds    Active Hamstring Stretch Limitations  supine, hands behind knee    Lower Trunk Rotation Limitations  10x10 sec    Figure 4 Stretch  30 seconds    Figure 4 Stretch Limitations  BLE, seated    Other Lumbar Stretch Exercise  forward flexion over physioball and to R/L for added QL stretch, 3x10 sec holds each direction      Lumbar Exercises: Standing   Functional Squats  10 reps    Functional Squats Limitations  BTB around thighs    Row  15 reps;Theraband    Theraband Level (Row)  Level 2 (Red)    Shoulder Extension  15 reps;Theraband    Theraband Level (Shoulder Extension)  Level 2 (Red)    Other Standing Lumbar Exercises  chops, RTB, x15 reps each direction  Other Standing Lumbar Exercises  forward step ups to 12" step for glute/HS activation, x5 reps each LE      Lumbar Exercises: Supine   Ab Set  15 reps;3 seconds    AB Set Limitations  hooklying, contract with exhale    Pelvic Tilt  20 reps;5 seconds    Pelvic Tilt Limitations  posterior, verbal/tactile cues and demo required    Bridge  10 reps    Bridge with clamshell  10 reps   BTB around thighs            PT Education - 10/06/19 1037    Education Details  HEP, exercise technique    Person(s) Educated  Patient    Methods  Explanation;Demonstration    Comprehension  Verbalized understanding;Returned demonstration       PT Short Term Goals - 09/15/19 1005      PT SHORT TERM GOAL #1   Title  Pt will perform HEP consistently and correctly to improve pain with functional mobility.    Time  3    Period  Weeks    Status  New    Target Date  10/06/19      PT SHORT TERM GOAL #2   Title  Pt will self report 25% improvement in sleeping ability due to  decreased RLE pain to improve QoL.    Time  3    Period  Weeks    Status  New        PT Long Term Goals - 09/15/19 1006      PT LONG TERM GOAL #1   Title  Pt will self report 75% improvement in sleeping ability due to decreased RLE pain to improve QoL.    Time  6    Period  Weeks    Status  New    Target Date  10/27/19      PT LONG TERM GOAL #2   Title  Pt will self report 5/10 LBP at worst with 30 minutes of walking to return to walking program with reduced pain.    Time  6    Period  Weeks    Status  New      PT LONG TERM GOAL #3   Title  Pt will improve RLE strength to equal LLE strength to improve gait, stairs, and tolerance to activity.    Time  6    Period  Weeks    Status  New      PT LONG TERM GOAL #4   Title  Pt will improve FOTO to 30% limited in daily activities to indicate significant improvement in QoL and daily functional ability.    Time  6    Period  Weeks    Status  New            Plan - 10/06/19 1030    Clinical Impression Statement  Pt requires verbal/tactile cues with demonstration for proper posterior pelvic tilt in hooklying position for strong ab contraction and decreased glute compensation. Added postural chops this session to engage core and continue posture strengthening with good ab activation and denies pain. Pt with increased R sided LBP with step ups that resolves with cessation of exercise. Ended with stretching over physioball to decrease flare up, and pt reports same pain as when started. Pt only attending 1x this week due to trip, reviewed exercises for continued gains on days off. Continue to progress as able.    Personal Factors and Comorbidities  Past/Current Experience;Time  since onset of injury/illness/exacerbation    Examination-Activity Limitations  Bend;Lift;Locomotion Level;Sit;Sleep;Stairs;Stand;Transfers    Examination-Participation Restrictions  Cleaning;Community Activity;Interpersonal Relationship;Laundry;Meal Prep;Yard  Work    Conservation officer, historic buildings  Evolving/Moderate complexity    Rehab Potential  Fair    PT Frequency  2x / week    PT Duration  6 weeks    PT Treatment/Interventions  ADLs/Self Care Home Management;Aquatic Therapy;Biofeedback;Cryotherapy;Electrical Stimulation;Moist Heat;DME Instruction;Gait training;Stair training;Functional mobility training;Therapeutic activities;Therapeutic exercise;Balance training;Neuromuscular re-education;Patient/family education;Orthotic Fit/Training;Manual techniques;Passive range of motion;Dry needling;Taping;Joint Manipulations    PT Next Visit Plan  Continue to progress core and hip strengthening as tolerated.  Add foam with paloff exercises. Continue with squats and lunges, reduce step height as able. Add vector stance when ready. Manuals PRN for pain and restricitons.    PT Home Exercise Plan  09/16/19: ab set, piriformis stretch, bridge, LTR; 4/13: supine hs stretch; 4/27: seated piriformis stretch    Consulted and Agree with Plan of Care  Patient       Patient will benefit from skilled therapeutic intervention in order to improve the following deficits and impairments:  Cardiopulmonary status limiting activity, Decreased activity tolerance, Decreased endurance, Decreased range of motion, Decreased strength, Hypomobility, Increased muscle spasms, Impaired perceived functional ability, Impaired sensation, Improper body mechanics, Obesity, Pain  Visit Diagnosis: Chronic bilateral low back pain without sciatica  Muscle weakness (generalized)  Other symptoms and signs involving the musculoskeletal system     Problem List Patient Active Problem List   Diagnosis Date Noted  . GENERALIZED ANXIETY DISORDER 08/02/2009  . CAD 06/30/2009  . BACK PAIN WITH RADICULOPATHY 06/30/2009  . ABDOMINAL PAIN, GENERALIZED 06/30/2009  . ACUTE CYSTITIS 06/24/2009  . BACK PAIN 06/24/2009  . Abdominal pain, other specified site 06/24/2009  . INSOMNIA 11/24/2008  .  MENORRHAGIA 11/17/2008  . OBESITY 12/17/2007  . NICOTINE ADDICTION 12/17/2007  . DEPRESSION 12/17/2007  . ASTHMA, UNSPECIFIED, UNSPECIFIED STATUS 12/17/2007     Domenick Bookbinder PT, DPT 10/06/19, 11:16 AM [phone]  [facility] [address] [place], [place], [address] Phone: [phone]   Fax:  [phone]  Name: Gina Robinson MRN: [id number] Date of Birth: 06-22-1960

## 2019-10-08 ENCOUNTER — Ambulatory Visit (HOSPITAL_COMMUNITY): Payer: 59 | Admitting: Physical Therapy

## 2019-10-13 ENCOUNTER — Encounter (HOSPITAL_COMMUNITY): Payer: 59

## 2019-10-15 ENCOUNTER — Telehealth (HOSPITAL_COMMUNITY): Payer: Self-pay | Admitting: Physical Therapy

## 2019-10-15 ENCOUNTER — Ambulatory Visit (HOSPITAL_COMMUNITY): Payer: 59 | Attending: Orthopedic Surgery | Admitting: Physical Therapy

## 2019-10-15 DIAGNOSIS — R29898 Other symptoms and signs involving the musculoskeletal system: Secondary | ICD-10-CM | POA: Insufficient documentation

## 2019-10-15 DIAGNOSIS — M545 Low back pain: Secondary | ICD-10-CM | POA: Insufficient documentation

## 2019-10-15 DIAGNOSIS — G8929 Other chronic pain: Secondary | ICD-10-CM | POA: Insufficient documentation

## 2019-10-15 DIAGNOSIS — M6281 Muscle weakness (generalized): Secondary | ICD-10-CM | POA: Insufficient documentation

## 2019-10-15 NOTE — Telephone Encounter (Signed)
Called patient about missed appointment today, no answer. Left voice message and reminder of upcoming appointment on 10/20/19.   5:52 PM, 10/15/19 Georges Lynch PT DPT  Physical Therapist with [facility]  [phone]

## 2019-10-20 ENCOUNTER — Other Ambulatory Visit: Payer: Self-pay

## 2019-10-20 ENCOUNTER — Encounter (HOSPITAL_COMMUNITY): Payer: Self-pay | Admitting: Physical Therapy

## 2019-10-20 ENCOUNTER — Telehealth (HOSPITAL_COMMUNITY): Payer: Self-pay | Admitting: Physical Therapy

## 2019-10-20 ENCOUNTER — Ambulatory Visit (HOSPITAL_COMMUNITY): Payer: 59 | Admitting: Physical Therapy

## 2019-10-20 DIAGNOSIS — G8929 Other chronic pain: Secondary | ICD-10-CM | POA: Diagnosis present

## 2019-10-20 DIAGNOSIS — R29898 Other symptoms and signs involving the musculoskeletal system: Secondary | ICD-10-CM

## 2019-10-20 DIAGNOSIS — M545 Low back pain, unspecified: Secondary | ICD-10-CM

## 2019-10-20 DIAGNOSIS — M6281 Muscle weakness (generalized): Secondary | ICD-10-CM

## 2019-10-20 NOTE — Telephone Encounter (Signed)
Pt wants to stop Aquatic Therapy she doesn't feel comfortable with herself - it has nothing to do with [place].

## 2019-10-20 NOTE — Therapy (Signed)
[facility] [address] [place], [place], [address] Phone: [phone]   Fax:  [phone]  Physical Therapy Treatment  Patient Details  Name: Gina Robinson MRN: [id number] Date of Birth: [birth year] Referring Provider (PT): Venita Lick, MD   Encounter Date: 10/20/2019  PT End of Session - 10/20/19 1040    Visit Number  7    Number of Visits  12    Date for PT Re-Evaluation  10/30/19    Authorization Type  [employer]: FOTO every 5 visits    Authorization - Number of Visits  30    Progress Note Due on Visit  12    PT Start Time  1032    PT Stop Time  1112    PT Time Calculation (min)  40 min    Activity Tolerance  Patient tolerated treatment well    Behavior During Therapy  [facility] for tasks assessed/performed       History reviewed. No pertinent past medical history.  Past Surgical History:  Procedure Laterality Date  . ABDOMINAL HYSTERECTOMY      There were no vitals filed for this visit.  Subjective Assessment - 10/20/19 1033    Subjective  Patient states both of her legs have been achy lately. Her back is about the same. She went to pain management doctor who said no injection for her back because it would just be a band aid.    Limitations  Sitting;Standing;Walking    How long can you sit comfortably?  10-30 minutes depending on time of day    How long can you stand comfortably?  10-45 minutes depending on time of day    How long can you walk comfortably?  10-45 minutes depending on time of day    Patient Stated Goals  hopefully help with some of the pain I guess    Currently in Pain?  No/denies    Pain Score  0-No pain    Pain Location  Back    Pain Onset  More than a month ago                       [facility] Adult PT Treatment/Exercise - 10/20/19 0001      Lumbar Exercises: Stretches   Other Lumbar Stretch Exercise  forward flexion over physioball and to R/L for added QL stretch, 3x10 sec holds each  direction      Lumbar Exercises: Standing   Side Lunge  10 reps    Side Lunge Limitations  2 sets bilateral    Row  15 reps    Theraband Level (Row)  Level 3 (Green)    Row Limitations  2 sets    Shoulder Extension  15 reps    Theraband Level (Shoulder Extension)  Level 2 (Red)    Shoulder Extension Limitations  2 sets    Other Standing Lumbar Exercises  Palof press on blue foam with red band 2x10 billateral     Other Standing Lumbar Exercises  SLS 2x30 seconds; SLS with vectors 5x bilateral             PT Education - 10/20/19 1040    Education Details  Educated on HEP, exercise technique    Person(s) Educated  Patient    Methods  Explanation;Demonstration    Comprehension  Verbalized understanding;Returned demonstration       PT Short Term Goals - 09/15/19 1005      PT SHORT TERM GOAL #1  Title  Pt will perform HEP consistently and correctly to improve pain with functional mobility.    Time  3    Period  Weeks    Status  New    Target Date  10/06/19      PT SHORT TERM GOAL #2   Title  Pt will self report 25% improvement in sleeping ability due to decreased RLE pain to improve QoL.    Time  3    Period  Weeks    Status  New        PT Long Term Goals - 09/15/19 1006      PT LONG TERM GOAL #1   Title  Pt will self report 75% improvement in sleeping ability due to decreased RLE pain to improve QoL.    Time  6    Period  Weeks    Status  New    Target Date  10/27/19      PT LONG TERM GOAL #2   Title  Pt will self report 5/10 LBP at worst with 30 minutes of walking to return to walking program with reduced pain.    Time  6    Period  Weeks    Status  New      PT LONG TERM GOAL #3   Title  Pt will improve RLE strength to equal LLE strength to improve gait, stairs, and tolerance to activity.    Time  6    Period  Weeks    Status  New      PT LONG TERM GOAL #4   Title  Pt will improve FOTO to 30% limited in daily activities to indicate significant  improvement in QoL and daily functional ability.    Time  6    Period  Weeks    Status  New            Plan - 10/20/19 1041    Clinical Impression Statement  Patient requires verbal cueing for set up with previously completed exercises. She has difficulty with lateral lunges despite cueing and demonstration for mechanics. She requires verbal cueing for core activation with palof exercise and demonstrates impaired balance while completing. She tolerates session well without c/o increase in symptoms. Patient educated on completing stretches and exercises at home for decreasing symptoms when she has flare ups later in the day. She demonstrates moderate sway with SLS with vectors requiring intermittent UE support. Patient will benefit from skilled physical therapy in order to improve function and reduce impairment.    Personal Factors and Comorbidities  Past/Current Experience;Time since onset of injury/illness/exacerbation    Examination-Activity Limitations  Bend;Lift;Locomotion Level;Sit;Sleep;Stairs;Stand;Transfers    Examination-Participation Restrictions  Cleaning;Community Activity;Interpersonal Relationship;Laundry;Meal Prep;Yard Work    Conservation officer, historic buildings  Evolving/Moderate complexity    Rehab Potential  Fair    PT Frequency  2x / week    PT Duration  6 weeks    PT Treatment/Interventions  ADLs/Self Care Home Management;Aquatic Therapy;Biofeedback;Cryotherapy;Electrical Stimulation;Moist Heat;DME Instruction;Gait training;Stair training;Functional mobility training;Therapeutic activities;Therapeutic exercise;Balance training;Neuromuscular re-education;Patient/family education;Orthotic Fit/Training;Manual techniques;Passive range of motion;Dry needling;Taping;Joint Manipulations    PT Next Visit Plan  Continue to progress core and hip strengthening as tolerated.  Continue with squats and lunges, reduce step height as able. Manuals PRN for pain and restricitons.    PT Home  Exercise Plan  09/16/19: ab set, piriformis stretch, bridge, LTR; 4/13: supine hs stretch; 4/27: seated piriformis stretch    Consulted and Agree with Plan of Care  Patient  Patient will benefit from skilled therapeutic intervention in order to improve the following deficits and impairments:  Cardiopulmonary status limiting activity, Decreased activity tolerance, Decreased endurance, Decreased range of motion, Decreased strength, Hypomobility, Increased muscle spasms, Impaired perceived functional ability, Impaired sensation, Improper body mechanics, Obesity, Pain  Visit Diagnosis: Chronic bilateral low back pain without sciatica  Muscle weakness (generalized)  Other symptoms and signs involving the musculoskeletal system     Problem List Patient Active Problem List   Diagnosis Date Noted  . GENERALIZED ANXIETY DISORDER 08/02/2009  . CAD 06/30/2009  . BACK PAIN WITH RADICULOPATHY 06/30/2009  . ABDOMINAL PAIN, GENERALIZED 06/30/2009  . ACUTE CYSTITIS 06/24/2009  . BACK PAIN 06/24/2009  . Abdominal pain, other specified site 06/24/2009  . INSOMNIA 11/24/2008  . MENORRHAGIA 11/17/2008  . OBESITY 12/17/2007  . NICOTINE ADDICTION 12/17/2007  . DEPRESSION 12/17/2007  . ASTHMA, UNSPECIFIED, UNSPECIFIED STATUS 12/17/2007    11:12 AM, 10/20/19 Mearl Latin PT, DPT Physical Therapist at [place], [place], [address] Phone: [phone]   Fax:  [phone]  Name: LEAH THORNBERRY MRN: [id number] Date of Birth: [birth year]

## 2019-10-21 ENCOUNTER — Encounter (HOSPITAL_COMMUNITY): Payer: Self-pay | Admitting: Physical Therapy

## 2019-10-21 ENCOUNTER — Other Ambulatory Visit: Payer: Self-pay

## 2019-10-21 ENCOUNTER — Ambulatory Visit (HOSPITAL_COMMUNITY): Payer: 59 | Admitting: Physical Therapy

## 2019-10-21 DIAGNOSIS — R29898 Other symptoms and signs involving the musculoskeletal system: Secondary | ICD-10-CM

## 2019-10-21 DIAGNOSIS — M545 Low back pain, unspecified: Secondary | ICD-10-CM

## 2019-10-21 DIAGNOSIS — M6281 Muscle weakness (generalized): Secondary | ICD-10-CM

## 2019-10-21 NOTE — Therapy (Signed)
[place], [place], [address] Phone: [phone]   Fax:  [phone]  Physical Therapy Treatment  Patient Details  Name: Gina Robinson MRN: [id number] Date of Birth: [birth year] Referring Provider (PT): Melina Schools, MD   Encounter Date: 10/21/2019  PT End of Session - 10/21/19 1439    Visit Number  8    Number of Visits  12    Date for PT Re-Evaluation  10/30/19    Authorization Type  [employer]: [place] every 5 visits    Authorization - Number of Visits  30    Progress Note Due on Visit  12    PT Start Time  1435    PT Stop Time  1514    PT Time Calculation (min)  39 min    Activity Tolerance  Patient tolerated treatment well    Behavior During Therapy  [facility] for tasks assessed/performed       History reviewed. No pertinent past medical history.  Past Surgical History:  Procedure Laterality Date  . ABDOMINAL HYSTERECTOMY      There were no vitals filed for this visit.  Subjective Assessment - 10/21/19 1432    Subjective  Patient states her back is doing good and she was not sore last night. She did exercises last night and it was helpful.    Limitations  Sitting;Standing;Walking    How long can you sit comfortably?  10-30 minutes depending on time of day    How long can you stand comfortably?  10-45 minutes depending on time of day    How long can you walk comfortably?  10-45 minutes depending on time of day    Patient Stated Goals  hopefully help with some of the pain I guess    Currently in Pain?  Yes    Pain Score  2     Pain Location  Back    Pain Onset  More than a month ago                        [facility] Adult PT Treatment/Exercise - 10/21/19 0001      Lumbar Exercises: Standing   Functional Squats  10 reps    Functional Squats Limitations  2 sets     Row  15 reps    Theraband Level (Row)  Level 3 (Green)    Row Limitations  2 sets     Shoulder Extension  15 reps    Theraband Level (Shoulder Extension)  Level 2 (Red)    Shoulder Extension Limitations  2 sets     Other Standing Lumbar Exercises  Palof press on blue foam with red band 2x10 billateral, step up 4 inch step 2 x 10 bilateral , lateral step down with eccentric control 1x10 bilateral    Other Standing Lumbar Exercises  SLS 2x30 seconds; SLS with vectors 5x bilateral             PT Education - 10/21/19 1439    Education Details  Educated on HEP, exercise technique    Person(s) Educated  Patient    Methods  Explanation;Demonstration    Comprehension  Verbalized understanding;Returned demonstration       PT Short Term Goals - 10/21/19 1450      PT SHORT TERM GOAL #1   Title  Pt will perform HEP consistently and correctly to improve pain with functional mobility.    Time  3  Period  Weeks    Status  Achieved    Target Date  10/06/19      PT SHORT TERM GOAL #2   Title  Pt will self report 25% improvement in sleeping ability due to decreased RLE pain to improve QoL.    Time  3    Period  Weeks    Status  Achieved        PT Long Term Goals - 10/21/19 1450      PT LONG TERM GOAL #1   Title  Pt will self report 75% improvement in sleeping ability due to decreased RLE pain to improve QoL.    Time  6    Period  Weeks    Status  On-going      PT LONG TERM GOAL #2   Title  Pt will self report 5/10 LBP at worst with 30 minutes of walking to return to walking program with reduced pain.    Time  6    Period  Weeks    Status  Achieved      PT LONG TERM GOAL #3   Title  Pt will improve RLE strength to equal LLE strength to improve gait, stairs, and tolerance to activity.    Time  6    Period  Weeks    Status  On-going      PT LONG TERM GOAL #4   Title  Pt will improve FOTO to 30% limited in daily activities to indicate significant improvement in QoL and daily functional ability.    Time  6    Period  Weeks    Status  On-going            Plan - 10/21/19 1441     Clinical Impression Statement  Patient demonstrates improving squat mechanics without verbal cueing for positioning and mechanics today. She requires intermittent UE support and unilateral finger support for SLS with vectors. She has to switch legs after each rep secondary to LE fatigue. Pain has been minimal and patient states she may be ready for discharge next week as long as symptoms remain decreased. Anticipate d/c next week. Patient has met 2/2 short term goals with ability to complete HEP and improving symptoms. She has met 1/4  long term goals with improved ambulation ability. She tolerates all exercises today without increase in symptoms. Patient will benefit from skilled physical therapy in order to improve function and reduce impairment.    Personal Factors and Comorbidities  Past/Current Experience;Time since onset of injury/illness/exacerbation    Examination-Activity Limitations  Bend;Lift;Locomotion Level;Sit;Sleep;Stairs;Stand;Transfers    Examination-Participation Restrictions  Cleaning;Community Activity;Interpersonal Relationship;Laundry;Meal Prep;Yard Work    [employer]chant [employer] officer  Evolving/Moderate complexity    Rehab Potential  Fair    PT Frequency  2x / week    PT Duration  6 weeks    PT Treatment/Interventions  ADLs/Self Care Home Management;Aquatic Therapy;Biofeedback;Cryotherapy;Electrical Stimulation;Moist Heat;DME Instruction;Gait training;Stair training;Functional mobility training;Therapeutic activities;Therapeutic exercise;Balance training;Neuromuscular re-education;Patient/family education;Orthotic Fit/Training;Manual techniques;Passive range of motion;Dry needling;Taping;Joint Manipulations    PT Next Visit Plan  Continue to progress core and hip strengthening as tolerated.  Continue with squats and lunges, reduce step height as able. Manuals PRN for pain and restricitons. anticipate d/c next session    PT Home Exercise Plan  09/16/19: ab set, piriformis stretch,  bridge, LTR; 4/13: supine hs stretch; 4/27: seated piriformis stretch    Consulted and Agree with Plan of Care  Patient       Patient will benefit from skilled  therapeutic intervention in order to improve the following deficits and impairments:  Cardiopulmonary status limiting activity, Decreased activity tolerance, Decreased endurance, Decreased range of motion, Decreased strength, Hypomobility, Increased muscle spasms, Impaired perceived functional ability, Impaired sensation, Improper body mechanics, Obesity, Pain  Visit Diagnosis: Chronic bilateral low back pain without sciatica  Muscle weakness (generalized)  Other symptoms and signs involving the musculoskeletal system     Problem List Patient Active Problem List   Diagnosis Date Noted  . GENERALIZED ANXIETY DISORDER 08/02/2009  . CAD 06/30/2009  . BACK PAIN WITH RADICULOPATHY 06/30/2009  . ABDOMINAL PAIN, GENERALIZED 06/30/2009  . ACUTE CYSTITIS 06/24/2009  . BACK PAIN 06/24/2009  . Abdominal pain, other specified site 06/24/2009  . INSOMNIA 11/24/2008  . MENORRHAGIA 11/17/2008  . OBESITY 12/17/2007  . NICOTINE ADDICTION 12/17/2007  . DEPRESSION 12/17/2007  . ASTHMA, UNSPECIFIED, UNSPECIFIED STATUS 12/17/2007    3:16 PM, 10/21/19 Mearl Latin PT, DPT Physical Therapist at [place], [place], [address] Phone: [phone]   Fax:  [phone]  Name: Gina Robinson MRN: [id number] Date of Birth: May 23, 1961

## 2019-10-21 NOTE — Patient Instructions (Signed)
Access Code: URL: [email or web address] Date: 10/21/2019 Prepared by: Greig Castilla Rever Pichette  Exercises Standing Anti-Rotation Press with Anchored Resistance - 1 x daily - 7 x weekly - 2 sets - 10 reps Standing Shoulder Row with Anchored Resistance - 1 x daily - 7 x weekly - 2 sets - 15 reps Shoulder extension with resistance - Neutral - 1 x daily - 7 x weekly - 2 sets - 15 reps

## 2019-10-22 ENCOUNTER — Ambulatory Visit (HOSPITAL_COMMUNITY): Payer: 59 | Admitting: Physical Therapy

## 2019-10-27 ENCOUNTER — Ambulatory Visit (HOSPITAL_COMMUNITY): Payer: 59 | Admitting: Physical Therapy

## 2019-10-27 ENCOUNTER — Other Ambulatory Visit: Payer: Self-pay

## 2019-10-27 DIAGNOSIS — G8929 Other chronic pain: Secondary | ICD-10-CM

## 2019-10-27 DIAGNOSIS — M545 Low back pain, unspecified: Secondary | ICD-10-CM

## 2019-10-27 DIAGNOSIS — M6281 Muscle weakness (generalized): Secondary | ICD-10-CM

## 2019-10-27 DIAGNOSIS — R29898 Other symptoms and signs involving the musculoskeletal system: Secondary | ICD-10-CM

## 2019-10-27 NOTE — Therapy (Signed)
[place], [place], [address] Phone: [phone]   Fax:  [phone]  Physical Therapy Treatment/Discharge Summary  Patient Details  Name: Gina Robinson MRN: [id number] Date of Birth: April 16, 1961 Referring Provider (PT): Melina Schools, MD   Encounter Date: 10/27/2019  PHYSICAL THERAPY DISCHARGE SUMMARY  Visits from Start of Care: 9  Current functional level related to goals / functional outcomes: See below   Remaining deficits: See below   Education / Equipment: See below  Plan: Patient agrees to discharge.  Patient goals were met. Patient is being discharged due to meeting the stated rehab goals.  ?????       PT End of Session - 10/27/19 1041    Visit Number  9    Number of Visits  12    Date for PT Re-Evaluation  10/30/19    Authorization Type  [employer]: FOTO every 5 visits    Authorization - Number of Visits  30    Progress Note Due on Visit  12    PT Start Time  1031    PT Stop Time  1110    PT Time Calculation (min)  39 min    Activity Tolerance  Patient tolerated treatment well    Behavior During Therapy  WFL for tasks assessed/performed       No past medical history on file.  Past Surgical History:  Procedure Laterality Date  . ABDOMINAL HYSTERECTOMY      There were no vitals filed for this visit.  Subjective Assessment - 10/27/19 1033    Subjective  Patient states 90% improvement since beginning physical therapy. She feels limited with pain in her leg and her balance. She has been completing exercises at home but her pain continues to bother her in the evening. She is ready for today to be her least day.    Limitations  Sitting;Standing;Walking    How long can you walk comfortably?  30 minutes    Patient Stated Goals  hopefully help with some of the pain I guess    Currently in Pain?  No/denies    Pain Location  Back         [facility] PT Assessment - 10/27/19 0001      Assessment   Medical Diagnosis  LBP    Referring Provider (PT)  Melina Schools, MD    Prior Therapy  Yes, no benefit       Precautions   Precautions  None      Restrictions   Weight Bearing Restrictions  No      Balance Screen   Has the patient fallen in the past 6 months  No    Has the patient had a decrease in activity level because of a fear of falling?   No    Is the patient reluctant to leave their home because of a fear of falling?   No      Prior Function   Level of Independence  Independent    Vocation  Retired    Science writer    Leisure  walks, Community education officer   Overall Cognitive Status  Within Functional Limits for tasks assessed      Observation/Other Assessments   Focus on Therapeutic Outcomes (FOTO)   31 % limited      AROM   Lumbar Flexion  WNL    Lumbar Extension  WNL    Lumbar - Right Side  Bend  WNL    Lumbar - Left Side Bend  WNL pain on R    Lumbar - Right Rotation  WNL slight pain    Lumbar - Left Rotation  WNL slight pain      Strength   Right Hip Flexion  4/5    Left Hip Flexion  4+/5    Right Knee Flexion  4+/5    Right Knee Extension  4+/5    Left Knee Flexion  4+/5    Left Knee Extension  4+/5    Right Ankle Dorsiflexion  5/5    Left Ankle Dorsiflexion  5/5                    [facility] Adult PT Treatment/Exercise - 10/27/19 0001      Lumbar Exercises: Standing   Row  15 reps    Theraband Level (Row)  Level 3 (Green)    Shoulder Extension  15 reps    Theraband Level (Shoulder Extension)  Level 3 Nyoka Cowden)             PT Education - 10/27/19 1039    Education Details  Educated on HEP, exercise technique, progress made, returning to physical therapy if needed, LBP, lifestyle modification    Person(s) Educated  Patient    Methods  Explanation;Demonstration    Comprehension  Verbalized understanding;Returned demonstration       PT Short Term Goals - 10/21/19 1450      PT SHORT TERM GOAL #1    Title  Pt will perform HEP consistently and correctly to improve pain with functional mobility.    Time  3    Period  Weeks    Status  Achieved    Target Date  10/06/19      PT SHORT TERM GOAL #2   Title  Pt will self report 25% improvement in sleeping ability due to decreased RLE pain to improve QoL.    Time  3    Period  Weeks    Status  Achieved        PT Long Term Goals - 10/27/19 1053      PT LONG TERM GOAL #1   Title  Pt will self report 75% improvement in sleeping ability due to decreased RLE pain to improve QoL.    Time  6    Period  Weeks    Status  Achieved      PT LONG TERM GOAL #2   Title  Pt will self report 5/10 LBP at worst with 30 minutes of walking to return to walking program with reduced pain.    Time  6    Period  Weeks    Status  Achieved      PT LONG TERM GOAL #3   Title  Pt will improve RLE strength to equal LLE strength to improve gait, stairs, and tolerance to activity.    Time  6    Period  Weeks    Status  Achieved      PT LONG TERM GOAL #4   Title  Pt will improve FOTO to 30% limited in daily activities to indicate significant improvement in QoL and daily functional ability.    Time  6    Period  Weeks    Status  Partially Met            Plan - 10/27/19 1041    Clinical Impression Statement  Patient has met 2/2 short term goals with ability  to complete HEP and improving symptoms. She has met 4/4 long term goals with improved ambulation ability, improved symptoms and increased strength. Patient continues to be limited by increase in symptoms as the day progresses and while sleeping. Discussed patient progress and further treatment options in the future. Patient educated on lifestyle modification, continuing HEP, and returning to physical therapy if needed. Patient requires demonstration to be able to complete rows and extensions. Patient discharged at this time.    Personal Factors and Comorbidities  Past/Current Experience;Time since onset  of injury/illness/exacerbation    Examination-Activity Limitations  Bend;Lift;Locomotion Level;Sit;Sleep;Stairs;Stand;Transfers    Examination-Participation Restrictions  Cleaning;Community Activity;Interpersonal Relationship;Laundry;Meal Prep;Yard Work    Merchant navy officer  --    Rehab Potential  Fair    PT Frequency  --    PT Duration  --    PT Treatment/Interventions  ADLs/Self Care Home Management;Aquatic Therapy;Biofeedback;Cryotherapy;Electrical Stimulation;Moist Heat;DME Instruction;Gait training;Stair training;Functional mobility training;Therapeutic activities;Therapeutic exercise;Balance training;Neuromuscular re-education;Patient/family education;Orthotic Fit/Training;Manual techniques;Passive range of motion;Dry needling;Taping;Joint Manipulations    PT Next Visit Plan  n/a    PT Home Exercise Plan  09/16/19: ab set, piriformis stretch, bridge, LTR; 4/13: supine hs stretch; 4/27: seated piriformis stretch rows, extensions    Consulted and Agree with Plan of Care  Patient       Patient will benefit from skilled therapeutic intervention in order to improve the following deficits and impairments:  Cardiopulmonary status limiting activity, Decreased activity tolerance, Decreased endurance, Decreased range of motion, Decreased strength, Hypomobility, Increased muscle spasms, Impaired perceived functional ability, Impaired sensation, Improper body mechanics, Obesity, Pain  Visit Diagnosis: Chronic bilateral low back pain without sciatica  Muscle weakness (generalized)  Other symptoms and signs involving the musculoskeletal system     Problem List Patient Active Problem List   Diagnosis Date Noted  . GENERALIZED ANXIETY DISORDER 08/02/2009  . CAD 06/30/2009  . BACK PAIN WITH RADICULOPATHY 06/30/2009  . ABDOMINAL PAIN, GENERALIZED 06/30/2009  . ACUTE CYSTITIS 06/24/2009  . BACK PAIN 06/24/2009  . Abdominal pain, other specified site 06/24/2009  . INSOMNIA  11/24/2008  . MENORRHAGIA 11/17/2008  . OBESITY 12/17/2007  . NICOTINE ADDICTION 12/17/2007  . DEPRESSION 12/17/2007  . ASTHMA, UNSPECIFIED, UNSPECIFIED STATUS 12/17/2007    11:13 AM, 10/27/19 Mearl Latin PT, DPT Physical Therapist at [place], [place], [address] Phone: [phone]   Fax:  [phone]  Name: Gina Robinson MRN: [id number] Date of Birth: 1960/09/07

## 2019-10-29 ENCOUNTER — Ambulatory Visit (HOSPITAL_COMMUNITY): Payer: 59 | Admitting: Physical Therapy

## 2019-10-29 ENCOUNTER — Telehealth (HOSPITAL_COMMUNITY): Payer: Self-pay | Admitting: Physical Therapy

## 2019-10-29 NOTE — Telephone Encounter (Signed)
L/m offered next PT apptments waiting on pt to call us back -req patient call us back to r/s or d/c.

## 2019-11-11 ENCOUNTER — Other Ambulatory Visit: Payer: Self-pay | Admitting: Chiropractic Medicine

## 2019-11-11 DIAGNOSIS — M5416 Radiculopathy, lumbar region: Secondary | ICD-10-CM

## 2019-11-24 ENCOUNTER — Ambulatory Visit
Admission: RE | Admit: 2019-11-24 | Discharge: 2019-11-24 | Disposition: A | Discharge status: Home / Self Care | Payer: 59 | Source: Ambulatory Visit | Attending: Chiropractic Medicine | Admitting: Chiropractic Medicine

## 2019-11-24 ENCOUNTER — Other Ambulatory Visit: Payer: Self-pay

## 2019-11-24 DIAGNOSIS — M5416 Radiculopathy, lumbar region: Secondary | ICD-10-CM

## 2019-11-24 MED ORDER — IOPAMIDOL (ISOVUE-M 200) INJECTION 41%
1.0000 mL | Freq: Once | INTRAMUSCULAR | Status: AC
  Administered 2019-11-24: 1 mL via EPIDURAL

## 2019-11-24 MED ORDER — METHYLPREDNISOLONE ACETATE 40 MG/ML INJ SUSP (RADIOLOG
120.0000 mg | Freq: Once | INTRAMUSCULAR | Status: AC
  Administered 2019-11-24: 120 mg via EPIDURAL

## 2019-11-24 NOTE — Discharge Instructions (Signed)

## 2020-01-07 ENCOUNTER — Other Ambulatory Visit: Payer: Self-pay | Admitting: Chiropractic Medicine

## 2020-01-07 DIAGNOSIS — M47896 Other spondylosis, lumbar region: Secondary | ICD-10-CM

## 2020-01-13 ENCOUNTER — Other Ambulatory Visit: Payer: 59

## 2020-02-03 ENCOUNTER — Other Ambulatory Visit: Payer: Self-pay | Admitting: Chiropractic Medicine

## 2020-02-03 DIAGNOSIS — M5416 Radiculopathy, lumbar region: Secondary | ICD-10-CM

## 2020-02-10 ENCOUNTER — Other Ambulatory Visit: Payer: 59

## 2020-02-12 ENCOUNTER — Ambulatory Visit
Admission: RE | Admit: 2020-02-12 | Discharge: 2020-02-12 | Disposition: A | Discharge status: Home / Self Care | Payer: 59 | Source: Ambulatory Visit | Attending: Chiropractic Medicine | Admitting: Chiropractic Medicine

## 2020-02-12 DIAGNOSIS — M5416 Radiculopathy, lumbar region: Secondary | ICD-10-CM

## 2020-02-12 MED ORDER — METHYLPREDNISOLONE ACETATE 40 MG/ML INJ SUSP (RADIOLOG
120.0000 mg | Freq: Once | INTRAMUSCULAR | Status: AC
  Administered 2020-02-12: 120 mg via EPIDURAL

## 2020-02-12 MED ORDER — IOPAMIDOL (ISOVUE-M 200) INJECTION 41%
1.0000 mL | Freq: Once | INTRAMUSCULAR | Status: AC
  Administered 2020-02-12: 1 mL via EPIDURAL

## 2020-02-12 NOTE — Discharge Instructions (Signed)

## 2020-04-16 ENCOUNTER — Other Ambulatory Visit: Payer: Self-pay

## 2020-04-16 ENCOUNTER — Ambulatory Visit
Admission: EM | Admit: 2020-04-16 | Discharge: 2020-04-16 | Disposition: A | Discharge status: Home / Self Care | Payer: 59 | Attending: Emergency Medicine | Admitting: Emergency Medicine

## 2020-04-16 ENCOUNTER — Ambulatory Visit (INDEPENDENT_AMBULATORY_CARE_PROVIDER_SITE_OTHER): Payer: 59

## 2020-04-16 ENCOUNTER — Encounter: Payer: Self-pay | Admitting: Emergency Medicine

## 2020-04-16 ENCOUNTER — Ambulatory Visit: Payer: Self-pay

## 2020-04-16 DIAGNOSIS — R0602 Shortness of breath: Secondary | ICD-10-CM | POA: Diagnosis not present

## 2020-04-16 DIAGNOSIS — J42 Unspecified chronic bronchitis: Secondary | ICD-10-CM | POA: Diagnosis not present

## 2020-04-16 DIAGNOSIS — Z8709 Personal history of other diseases of the respiratory system: Secondary | ICD-10-CM | POA: Diagnosis not present

## 2020-04-16 DIAGNOSIS — J069 Acute upper respiratory infection, unspecified: Secondary | ICD-10-CM | POA: Diagnosis not present

## 2020-04-16 DIAGNOSIS — R062 Wheezing: Secondary | ICD-10-CM | POA: Diagnosis not present

## 2020-04-16 MED ORDER — BENZONATATE 100 MG PO CAPS
100.0000 mg | ORAL_CAPSULE | Freq: Three times a day (TID) | ORAL | 0 refills | Status: DC
Start: 2020-04-16 — End: 2020-11-12

## 2020-04-16 MED ORDER — DEXAMETHASONE 4 MG PO TABS: 4.0000 mg | ORAL_TABLET | Freq: Every day | ORAL | 0 refills | Status: AC

## 2020-04-16 NOTE — ED Provider Notes (Signed)
[facility] CARE CENTER   [id number] 04/16/20 Arrival Time: [phone]   Chief Complaint  Patient presents with  . COPD  . Shortness of Breath     SUBJECTIVE: History from: patient.  PASCUALA KLUTTS is a 59 y.o. female who presented to the urgent care with a complaint of shortness of breath, wheezing, cough, COPD flare and bronchitis for the past few days.  Patient has seen PCP via telehealth and was prescribed azithromycin, prednisone with no symptom relief.  She reports a negative COVID-19 test prior to starting antibiotic and prednisone..  Denies sick exposure to COVID, flu or strep.  Denies recent travel.   She states symptom has not been improving.   Denies fever, chills, fatigue, sinus pain, rhinorrhea, sore throat, chest pain, nausea, changes in bowel or bladder habits.     ROS: As per HPI.  All other pertinent ROS negative.     History reviewed. No pertinent past medical history. Past Surgical History:  Procedure Laterality Date  . ABDOMINAL HYSTERECTOMY     Allergies  Allergen Reactions  . Penicillins    No current facility-administered medications on file prior to encounter.   Current Outpatient Medications on File Prior to Encounter  Medication Sig Dispense Refill  . amLODipine (NORVASC) 5 MG tablet Take 5 mg by mouth daily.    Marland Kitchen gabapentin (NEURONTIN) 300 MG capsule     . hydrochlorothiazide (HYDRODIURIL) 25 MG tablet Take 25 mg by mouth daily.     Social History   Socioeconomic History  . Marital status: Married    Spouse name: Not on file  . Number of children: Not on file  . Years of education: Not on file  . Highest education level: Not on file  Occupational History  . Not on file  Tobacco Use  . Smoking status: Current Every Day Smoker    Packs/day: 1.00    Types: Cigarettes  . Smokeless tobacco: Never Used  Substance and Sexual Activity  . Alcohol use: Not Currently  . Drug use: Not Currently  . Sexual activity: Not on file  Other Topics Concern  .  Not on file  Social History Narrative  . Not on file   Social Determinants of Health   Financial Resource Strain:   . Difficulty of Paying Living Expenses: Not on file  Food Insecurity:   . Worried About Programme researcher, broadcasting/film/video in the Last Year: Not on file  . Ran Out of Food in the Last Year: Not on file  Transportation Needs:   . Lack of Transportation (Medical): Not on file  . Lack of Transportation (Non-Medical): Not on file  Physical Activity:   . Days of Exercise per Week: Not on file  . Minutes of Exercise per Session: Not on file  Stress:   . Feeling of Stress : Not on file  Social Connections:   . Frequency of Communication with Friends and Family: Not on file  . Frequency of Social Gatherings with Friends and Family: Not on file  . Attends Religious Services: Not on file  . Active Member of Clubs or Organizations: Not on file  . Attends Banker Meetings: Not on file  . Marital Status: Not on file  Intimate Partner Violence:   . Fear of Current or Ex-Partner: Not on file  . Emotionally Abused: Not on file  . Physically Abused: Not on file  . Sexually Abused: Not on file   No family history on file.  OBJECTIVE:  Vitals:  04/16/20 0856  BP: (!) 142/87  Pulse: 70  Resp: 15  Temp: 99.2 F (37.3 C)  SpO2: 98%     General appearance: alert; appears fatigued, but nontoxic; speaking in full sentences and tolerating own secretions HEENT: NCAT; Ears: EACs clear, TMs pearly gray; Eyes: PERRL.  EOM grossly intact. Sinuses: nontender; Nose: nares patent without rhinorrhea, Throat: oropharynx clear, tonsils non erythematous or enlarged, uvula midline  Neck: supple without LAD Lungs: unlabored respirations, symmetrical air entry; cough: moderate; no respiratory distress; CTAB Heart: regular rate and rhythm.  Radial pulses 2+ symmetrical bilaterally Skin: warm and dry Psychological: alert and cooperative; normal mood and affect  LABS:  No results found for  this or any previous visit (from the past 24 hour(s)).   RADIOLOGY  DG Chest 2 View  Result Date: 04/16/2020 CLINICAL DATA:  59 year old female with history of COPD. Wheezing. Shortness of breath. EXAM: CHEST - 2 VIEW COMPARISON:  Chest x-ray 04/23/2010. FINDINGS: Lung volumes are normal. No consolidative airspace disease. No pleural effusions. No pneumothorax. No pulmonary nodule or mass noted. Pulmonary vasculature and the cardiomediastinal silhouette are within normal limits. IMPRESSION: No radiographic evidence of acute cardiopulmonary disease. Electronically Signed   By: Trudie Reed M.D.   On: 04/16/2020 10:40   Chest x-ray is negative for cardiopulmonary disease I have reviewed the x-ray myself and the radiologist interpretation.  I am in agreement with the radiologist interpretation.  ASSESSMENT & PLAN:  1. Viral URI with cough   2. Chronic bronchitis, unspecified chronic bronchitis type (HCC)     Meds ordered this encounter  Medications  . dexamethasone (DECADRON) 4 MG tablet    Sig: Take 1 tablet (4 mg total) by mouth daily for 7 days.    Dispense:  7 tablet    Refill:  0  . benzonatate (TESSALON) 100 MG capsule    Sig: Take 1 capsule (100 mg total) by mouth every 8 (eight) hours.    Dispense:  45 capsule    Refill:  0    Discharge instructions  Get plenty of rest and push fluids Tessalon Perles prescribed for cough Decadron was prescribed Take all other medication prescribed by PCP Continue to use albuterol as prescribed use medications daily for symptom relief Use OTC medications like ibuprofen or tylenol as needed fever or pain Call or go to the ED if you have any new or worsening symptoms such as fever, worsening cough, shortness of breath, chest tightness, chest pain, turning blue, changes in mental status, etc...   Reviewed expectations re: course of current medical issues. Questions answered. Outlined signs and symptoms indicating need for more acute  intervention. Patient verbalized understanding. After Visit Summary given.         Durward Parcel, FNP 04/16/20 1059

## 2020-04-16 NOTE — Discharge Instructions (Signed)
Get plenty of rest and push fluids Tessalon Perles prescribed for cough Decadron was prescribed Take all other medication prescribed by PCP Continue to use albuterol as prescribed use medications daily for symptom relief Use OTC medications like ibuprofen or tylenol as needed fever or pain Call or go to the ED if you have any new or worsening symptoms such as fever, worsening cough, shortness of breath, chest tightness, chest pain, turning blue, changes in mental status, etc..Marland Kitchen

## 2020-04-16 NOTE — ED Triage Notes (Signed)
Patient states that she has bronchitis- finished meds from her pcp. took last anitibiotic today and covid test was negative. finished her z-pack and predinose and cough syrup.

## 2020-10-12 ENCOUNTER — Other Ambulatory Visit: Payer: Self-pay | Admitting: Chiropractic Medicine

## 2020-10-12 DIAGNOSIS — M5416 Radiculopathy, lumbar region: Secondary | ICD-10-CM

## 2020-10-27 ENCOUNTER — Ambulatory Visit
Admission: RE | Admit: 2020-10-27 | Discharge: 2020-10-27 | Disposition: A | Discharge status: Home / Self Care | Payer: 59 | Source: Ambulatory Visit | Attending: Chiropractic Medicine | Admitting: Chiropractic Medicine

## 2020-10-27 DIAGNOSIS — M5416 Radiculopathy, lumbar region: Secondary | ICD-10-CM

## 2020-10-27 MED ORDER — METHYLPREDNISOLONE ACETATE 40 MG/ML INJ SUSP (RADIOLOG
80.0000 mg | Freq: Once | INTRAMUSCULAR | Status: AC
  Administered 2020-10-27: 80 mg via EPIDURAL

## 2020-10-27 MED ORDER — IOPAMIDOL (ISOVUE-M 200) INJECTION 41%
1.0000 mL | Freq: Once | INTRAMUSCULAR | Status: AC
  Administered 2020-10-27: 1 mL via EPIDURAL

## 2020-10-27 NOTE — Discharge Instructions (Signed)

## 2020-11-12 ENCOUNTER — Ambulatory Visit
Admission: RE | Admit: 2020-11-12 | Discharge: 2020-11-12 | Disposition: A | Discharge status: Home / Self Care | Payer: 59 | Source: Ambulatory Visit | Attending: Emergency Medicine | Admitting: Emergency Medicine

## 2020-11-12 ENCOUNTER — Ambulatory Visit (INDEPENDENT_AMBULATORY_CARE_PROVIDER_SITE_OTHER): Payer: 59

## 2020-11-12 ENCOUNTER — Other Ambulatory Visit: Payer: Self-pay

## 2020-11-12 VITALS — BP 172/83 | HR 102 | Temp 98.6°F | Resp 24

## 2020-11-12 DIAGNOSIS — R0602 Shortness of breath: Secondary | ICD-10-CM | POA: Diagnosis not present

## 2020-11-12 DIAGNOSIS — J441 Chronic obstructive pulmonary disease with (acute) exacerbation: Secondary | ICD-10-CM

## 2020-11-12 DIAGNOSIS — Z8701 Personal history of pneumonia (recurrent): Secondary | ICD-10-CM | POA: Diagnosis not present

## 2020-11-12 DIAGNOSIS — R059 Cough, unspecified: Secondary | ICD-10-CM

## 2020-11-12 MED ORDER — BENZONATATE 100 MG PO CAPS
100.0000 mg | ORAL_CAPSULE | Freq: Three times a day (TID) | ORAL | 0 refills | Status: DC | PRN
Start: 2020-11-12 — End: 2021-12-27

## 2020-11-12 MED ORDER — AZITHROMYCIN 250 MG PO TABS
250.0000 mg | ORAL_TABLET | Freq: Every day | ORAL | 0 refills | Status: DC
Start: 2020-11-12 — End: 2021-12-27

## 2020-11-12 MED ORDER — PREDNISONE 10 MG (21) PO TBPK
ORAL_TABLET | Freq: Every day | ORAL | 0 refills | Status: DC
Start: 2020-11-12 — End: 2021-01-15

## 2020-11-12 NOTE — Discharge Instructions (Addendum)
Get plenty of rest and push fluids Tessalon Perles prescribed for cough Prescribed prednisone for wheezing Azithromycin was prescribed use as directed Continue albuterol and Advair as prescribed and directed Follow-up with PCP Use medications daily for symptom relief Use OTC medications like ibuprofen or tylenol as needed fever or pain Call or go to the ED if you have any new or worsening symptoms such as fever, worsening cough, shortness of breath, chest tightness, chest pain, turning blue, changes in mental status, etc..Marland Kitchen

## 2020-11-12 NOTE — ED Triage Notes (Signed)
Pt presents with upper back pain with increased sob, has been using nebulizer beginning yesterday with little relief

## 2020-11-12 NOTE — ED Provider Notes (Signed)
[facility] CARE CENTER   [id number] 11/12/20 Arrival Time: 1054   Chief Complaint  Patient presents with  . Shortness of Breath  . Back Pain    SUBJECTIVE: History from: patient.  Gina Robinson is a 60 y.o. female with history of COPD, asthma and current smoker who presented to the urgent care with a complaint of upper back pain, shortness of breath, cough for the past few days.  Denies sick exposure to COVID, flu or strep.  Denies recent travel.  Has tried Advair and albuterol without relief.  Denies alleviating or aggravating factors.  Report previous symptoms in the past.   Denies fever, chills, fatigue, sinus pain, rhinorrhea, sore throat, wheezing, chest pain, nausea, changes in bowel or bladder habits.    ROS: As per HPI.  All other pertinent ROS negative.     History reviewed. No pertinent past medical history. Past Surgical History:  Procedure Laterality Date  . ABDOMINAL HYSTERECTOMY     Allergies  Allergen Reactions  . Penicillins    No current facility-administered medications on file prior to encounter.   Current Outpatient Medications on File Prior to Encounter  Medication Sig Dispense Refill  . amLODipine (NORVASC) 5 MG tablet Take 5 mg by mouth daily.    Marland Kitchen gabapentin (NEURONTIN) 300 MG capsule     . hydrochlorothiazide (HYDRODIURIL) 25 MG tablet Take 25 mg by mouth daily.     Social History   Socioeconomic History  . Marital status: Widowed    Spouse name: Not on file  . Number of children: Not on file  . Years of education: Not on file  . Highest education level: Not on file  Occupational History  . Not on file  Tobacco Use  . Smoking status: Current Every Day Smoker    Packs/day: 1.00    Types: Cigarettes  . Smokeless tobacco: Never Used  Substance and Sexual Activity  . Alcohol use: Not Currently  . Drug use: Not Currently  . Sexual activity: Not on file  Other Topics Concern  . Not on file  Social History Narrative  . Not on file    Social Determinants of Health   Financial Resource Strain: Not on file  Food Insecurity: Not on file  Transportation Needs: Not on file  Physical Activity: Not on file  Stress: Not on file  Social Connections: Not on file  Intimate Partner Violence: Not on file   History reviewed. No pertinent family history.  OBJECTIVE:  Vitals:   11/12/20 1112  BP: (!) 172/83  Pulse: (!) 102  Resp: (!) 24  Temp: 98.6 F (37 C)  SpO2: 93%     General appearance: alert; appears fatigued, but nontoxic; speaking in full sentences and tolerating own secretions HEENT: NCAT; Ears: EACs clear, TMs pearly gray; Eyes: PERRL.  EOM grossly intact. Sinuses: nontender; Nose: nares patent without rhinorrhea, Throat: oropharynx clear, tonsils non erythematous or enlarged, uvula midline  Neck: supple without LAD Lungs: unlabored respirations, symmetrical air entry; cough: moderate; no respiratory distress; wheezing present in bilateral lung Heart: regular rate and rhythm.  Radial pulses 2+ symmetrical bilaterally Skin: warm and dry Psychological: alert and cooperative; normal mood and affect  LABS:  No results found for this or any previous visit (from the past 24 hour(s)).   DG Chest 2 View  Result Date: 11/12/2020 CLINICAL DATA:  Breath shortness of breath, cough, upper back pain with low oxygen saturation, smoker, history pneumonia EXAM: CHEST - 2 VIEW COMPARISON:  04/16/2020 FINDINGS: Normal  heart size, mediastinal contours, and pulmonary vascularity. Minimal atherosclerotic calcification aortic arch. Minimal bibasilar atelectasis. Lungs otherwise clear. No pulmonary infiltrate, pleural effusion, or pneumothorax. Osseous structures unremarkable. IMPRESSION: Minimal bibasilar atelectasis. Aortic Atherosclerosis (ICD10-I70.0). Electronically Signed   By: Ulyses Southward M.D.   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 She has multilevel degenerative disc disease. Prior injections at L4-L5 have resulted in approximately 40% pain relief. She presents today for a right L2-L3 trans laminar epidural steroid injection. FLUOROSCOPY TIME:  0 minutes 39 seconds 7.2 mGy PROCEDURE: The procedure, risks, benefits, and alternatives were explained to the patient. Questions regarding the procedure were encouraged and answered. The patient understands and consents to the procedure. LUMBAR EPIDURAL INJECTION: An interlaminar approach was performed on right at L2-L3. The overlying skin was cleansed and anesthetized. A 20 gauge epidural needle was advanced using loss-of-resistance technique. DIAGNOSTIC EPIDURAL INJECTION: Injection of Isovue-M 200 shows a good epidural pattern with spread above and below the level of needle placement, primarily on the right no vascular opacification is seen. THERAPEUTIC EPIDURAL INJECTION: 80 mg of Depo-Medrol mixed with 2 mL 1% lidocaine were instilled. The procedure was well-tolerated, and the patient was discharged thirty minutes following the injection in good condition. COMPLICATIONS: None immediate. IMPRESSION: Technically successful epidural injection on the right L2-L3 #1. Electronically Signed   By: Malachy Moan M.D.   On: 10/27/2020 14:38   Chest X-ray is negative for acute cardiopulmonary disease.  I have reviewed the x-ray myself and the radiologist interpretation.  I am in agreement with the radiologist interpretation.  ASSESSMENT & PLAN:  1. COPD exacerbation (HCC)     Meds ordered this encounter  Medications  . predniSONE (STERAPRED UNI-PAK 21 TAB) 10 MG (21) TBPK tablet    Sig: Take by mouth daily. Take 6 tabs by mouth daily  for 1 days, then 5 tabs for 1 days, then 4 tabs for 1 days, then 3 tabs for 1 days, 2 tabs for 1 days, then 1 tab by mouth daily for 1 days    Dispense:  21 tablet    Refill:  0  .  benzonatate (TESSALON) 100 MG capsule    Sig: Take 1 capsule (100 mg total) by mouth 3 (three) times daily as needed for cough.    Dispense:  30 capsule    Refill:  0  . azithromycin (ZITHROMAX) 250 MG tablet    Sig: Take 1 tablet (250 mg total) by mouth daily. Take first 2 tablets together, then 1 every day until finished.    Dispense:  6 tablet    Refill:  0    Discharge instructions  Get plenty of rest and push fluids Tessalon Perles prescribed for cough Prescribed prednisone for wheezing Azithromycin was prescribed use as directed Continue albuterol and Advair as prescribed and directed Follow-up with PCP Use medications daily for symptom relief Use OTC medications like ibuprofen or tylenol as needed fever or pain Call or go to the ED if you have any new or worsening symptoms such as fever, worsening cough, shortness of breath, chest tightness, chest pain, turning blue, changes in mental status, etc...   Reviewed expectations re: course of current medical issues. Questions answered. Outlined signs and symptoms indicating need for more acute intervention. Patient verbalized understanding. After Visit Summary given.         Durward Parcel, FNP 11/12/20 1221

## 2021-01-15 ENCOUNTER — Other Ambulatory Visit: Payer: Self-pay

## 2021-01-15 ENCOUNTER — Ambulatory Visit
Admission: EM | Admit: 2021-01-15 | Discharge: 2021-01-15 | Disposition: A | Discharge status: Home / Self Care | Payer: 59 | Attending: Family Medicine | Admitting: Family Medicine

## 2021-01-15 ENCOUNTER — Encounter: Payer: Self-pay | Admitting: Emergency Medicine

## 2021-01-15 DIAGNOSIS — J441 Chronic obstructive pulmonary disease with (acute) exacerbation: Secondary | ICD-10-CM

## 2021-01-15 DIAGNOSIS — U071 COVID-19: Secondary | ICD-10-CM

## 2021-01-15 MED ORDER — DOXYCYCLINE HYCLATE 100 MG PO CAPS
100.0000 mg | ORAL_CAPSULE | Freq: Two times a day (BID) | ORAL | 0 refills | Status: DC
Start: 2021-01-15 — End: 2021-12-27

## 2021-01-15 MED ORDER — PREDNISONE 20 MG PO TABS
40.0000 mg | ORAL_TABLET | Freq: Every day | ORAL | 0 refills | Status: DC
Start: 2021-01-15 — End: 2021-12-27

## 2021-01-15 MED ORDER — MOLNUPIRAVIR EUA 200MG CAPSULE: 4.0000 | ORAL_CAPSULE | Freq: Two times a day (BID) | ORAL | 0 refills | Status: AC

## 2021-01-15 NOTE — ED Provider Notes (Signed)
[facility]    CSN: [id number] Arrival date & time: 01/15/21  1151      History   Chief Complaint Chief Complaint  Patient presents with   Fever   Nasal Congestion   Generalized Body Aches    HPI Gina Robinson is a 60 y.o. female.   HPI Patient presents with URI symptoms including cough, sore throat, otalgia, nasal congestion, body aches, and sinus pressure.  Patient tested positive for COVID with a home test last evening.  She has a history of COPD and is a daily smoker . She has had some mild shortness of breath since symptoms started.  Would like to initiate antiviral therapy.  Is prescribed chronic inhalers and has been using inhalers as prescribed.     History reviewed. No pertinent past medical history.  Patient Active Problem List   Diagnosis Date Noted   GENERALIZED ANXIETY DISORDER 08/02/2009   CAD 06/30/2009   BACK PAIN WITH RADICULOPATHY 06/30/2009   ABDOMINAL PAIN, GENERALIZED 06/30/2009   ACUTE CYSTITIS 06/24/2009   BACK PAIN 06/24/2009   Abdominal pain, other specified site 06/24/2009   INSOMNIA 11/24/2008   MENORRHAGIA 11/17/2008   OBESITY 12/17/2007   NICOTINE ADDICTION 12/17/2007   DEPRESSION 12/17/2007   ASTHMA, UNSPECIFIED, UNSPECIFIED STATUS 12/17/2007    Past Surgical History:  Procedure Laterality Date   ABDOMINAL HYSTERECTOMY      OB History   No obstetric history on file.      Home Medications    Prior to Admission medications   Medication Sig Start Date End Date Taking? Authorizing Provider  doxycycline (VIBRAMYCIN) 100 MG capsule Take 1 capsule (100 mg total) by mouth 2 (two) times daily. 01/15/21  Yes Bing Neighbors, FNP  predniSONE (DELTASONE) 20 MG tablet Take 2 tablets (40 mg total) by mouth daily with breakfast. 01/15/21  Yes Bing Neighbors, FNP  amLODipine (NORVASC) 5 MG tablet Take 5 mg by mouth daily. 06/23/19   [provider]  azithromycin (ZITHROMAX) 250 MG tablet Take 1 tablet (250 mg total)  by mouth daily. Take first 2 tablets together, then 1 every day until finished. 11/12/20   Avegno, Zachery Dakins, FNP  benzonatate (TESSALON) 100 MG capsule Take 1 capsule (100 mg total) by mouth 3 (three) times daily as needed for cough. 11/12/20   Avegno, Zachery Dakins, FNP  gabapentin (NEURONTIN) 300 MG capsule  05/12/19   [provider]  hydrochlorothiazide (HYDRODIURIL) 25 MG tablet Take 25 mg by mouth daily. 06/23/19   [provider]    Family History History reviewed. No pertinent family history.  Social History Social History   Tobacco Use   Smoking status: Every Day    Packs/day: 1.00    Types: Cigarettes   Smokeless tobacco: Never  Substance Use Topics   Alcohol use: Not Currently   Drug use: Not Currently     Allergies   Penicillins   Review of Systems Review of Systems Pertinent negatives listed in HPI  Physical Exam Triage Vital Signs ED Triage Vitals [01/15/21 1200]  Enc Vitals Group     BP 117/76     Pulse Rate 86     Resp (!) 22     Temp 98.8 F (37.1 C)     Temp src      SpO2 93 %     Weight      Height      Head Circumference      Peak Flow  Pain Score 6     Pain Loc      Pain Edu?      Excl. in GC?    No data found.  Updated Vital Signs BP 117/76   Pulse 86   Temp 98.8 F (37.1 C) Comment: Ibuprofen before arriving  Resp (!) 22   SpO2 93%   Visual Acuity Right Eye Distance:   Left Eye Distance:   Bilateral Distance:    Right Eye Near:   Left Eye Near:    Bilateral Near:     Physical Exam General appearance: Alert, Ill-appearing, no distress Head: Normocephalic, without obvious abnormality, atraumatic ENT: External ear normal, nares with mucosal edema, congestion, erythematous oropharynx w/o exudate Respiratory: Respirations even , unlabored, coarse lung sound, expiratory wheeze Heart: Rate and rhythm normal. No gallop or murmurs noted on exam  Extremities: No gross deformities Skin: Skin color, texture, turgor  normal. No rashes seen  Psych: Appropriate mood and affect. Neurologic: GCS  15, normal coordination, normal gait  UC Treatments / Results  Labs (all labs ordered are listed, but only abnormal results are displayed) Labs Reviewed - No data to display  EKG   Radiology No results found.  Procedures Procedures (including critical care time)  Medications Ordered in UC Medications - No data to display  Initial Impression / Assessment and Plan / UC Course  I have reviewed the triage vital signs and the nursing notes.  Pertinent labs & imaging results that were available during my care of the patient were reviewed by me and considered in my medical decision making (see chart for details).     COVID-19 positive with current active COPD exacerbation.  Treatment per discharge instructions.  Antiviral therapy initiated.  Patient advised to go to the ER if any of her symptoms worsening or become severe.  Follow-up with PCP as needed. Final Clinical Impressions(s) / UC Diagnoses   Final diagnoses:  COVID-19 virus infection  COPD exacerbation [facility])   Discharge Instructions   None    ED Prescriptions     Medication Sig Dispense Auth. Provider   molnupiravir EUA 200 mg CAPS Take 4 capsules (800 mg total) by mouth 2 (two) times daily for 5 days. 40 capsule Bing Neighbors, FNP   predniSONE (DELTASONE) 20 MG tablet Take 2 tablets (40 mg total) by mouth daily with breakfast. 10 tablet Bing Neighbors, FNP   doxycycline (VIBRAMYCIN) 100 MG capsule Take 1 capsule (100 mg total) by mouth 2 (two) times daily. 20 capsule Bing Neighbors, FNP      PDMP not reviewed this encounter.   Bing Neighbors, [place] 01/22/21 [phone]

## 2021-01-15 NOTE — ED Triage Notes (Signed)
Pt here with + COVID test last night with sx starting yesterday. Nasal congestion, body aches, and fever. Hx COPD. Vaccinated. Would like anti-viral.

## 2021-01-26 ENCOUNTER — Telehealth: Payer: Self-pay

## 2021-01-26 NOTE — Telephone Encounter (Signed)
Patient called for information about Covid-19, all questions answered.

## 2021-02-01 ENCOUNTER — Other Ambulatory Visit: Payer: Self-pay | Admitting: Chiropractic Medicine

## 2021-02-01 DIAGNOSIS — M5416 Radiculopathy, lumbar region: Secondary | ICD-10-CM

## 2021-02-15 ENCOUNTER — Other Ambulatory Visit: Payer: Self-pay

## 2021-02-15 ENCOUNTER — Ambulatory Visit
Admission: RE | Admit: 2021-02-15 | Discharge: 2021-02-15 | Disposition: A | Discharge status: Home / Self Care | Payer: 59 | Source: Ambulatory Visit | Attending: Chiropractic Medicine | Admitting: Chiropractic Medicine

## 2021-02-15 DIAGNOSIS — M5416 Radiculopathy, lumbar region: Secondary | ICD-10-CM

## 2021-02-15 MED ORDER — METHYLPREDNISOLONE ACETATE 40 MG/ML INJ SUSP (RADIOLOG
80.0000 mg | Freq: Once | INTRAMUSCULAR | Status: AC
  Administered 2021-02-15: 80 mg via EPIDURAL

## 2021-02-15 MED ORDER — IOPAMIDOL (ISOVUE-M 200) INJECTION 41%
1.0000 mL | Freq: Once | INTRAMUSCULAR | Status: AC
  Administered 2021-02-15: 1 mL via EPIDURAL

## 2021-02-15 NOTE — Discharge Instructions (Signed)

## 2021-02-15 NOTE — Progress Notes (Signed)
Nurses were called to the procedure room due to patient having a syncopal episode post procedure. Upon our arrival, the patient was starting to be more alert, diaphoretic, was speaking in complete sentences. Vitals obtained, see chart. Will monitor until discharged by Dr. Archer Asa.

## 2021-12-08 DIAGNOSIS — M5416 Radiculopathy, lumbar region: Secondary | ICD-10-CM | POA: Diagnosis not present

## 2021-12-13 ENCOUNTER — Other Ambulatory Visit: Payer: Self-pay | Admitting: Chiropractic Medicine

## 2021-12-13 ENCOUNTER — Other Ambulatory Visit: Payer: Self-pay | Admitting: Physical Medicine and Rehabilitation

## 2021-12-13 DIAGNOSIS — M5459 Other low back pain: Secondary | ICD-10-CM

## 2021-12-13 DIAGNOSIS — M5416 Radiculopathy, lumbar region: Secondary | ICD-10-CM

## 2021-12-25 ENCOUNTER — Other Ambulatory Visit: Payer: Medicaid Other

## 2021-12-27 ENCOUNTER — Ambulatory Visit
Admission: RE | Admit: 2021-12-27 | Discharge: 2021-12-27 | Disposition: A | Discharge status: Home / Self Care | Payer: 59 | Source: Ambulatory Visit

## 2021-12-27 VITALS — BP 144/88 | HR 75 | Temp 98.4°F | Resp 18

## 2021-12-27 DIAGNOSIS — W57XXXA Bitten or stung by nonvenomous insect and other nonvenomous arthropods, initial encounter: Secondary | ICD-10-CM

## 2021-12-27 DIAGNOSIS — S70361A Insect bite (nonvenomous), right thigh, initial encounter: Secondary | ICD-10-CM

## 2021-12-27 HISTORY — DX: Essential (primary) hypertension: I10

## 2021-12-27 MED ORDER — DOXYCYCLINE HYCLATE 100 MG PO CAPS: 100.0000 mg | ORAL_CAPSULE | Freq: Two times a day (BID) | ORAL | 0 refills | Status: AC

## 2021-12-27 NOTE — ED Triage Notes (Signed)
Insect bite on right upper leg since Sunday.  Area red with a white head.

## 2021-12-27 NOTE — Discharge Instructions (Signed)
-   Please take the doxycycline twice daily for 7 days to treat any infection around or in the bite - If the white area gets bigger, redness spreads, please return to urgent care -You can use warm compress to help draw out infection

## 2021-12-27 NOTE — ED Provider Notes (Signed)
[facility]    CSN: [id number] Arrival date & time: 12/27/21  1047      History   Chief Complaint Chief Complaint  Patient presents with   Insect Bite    Entered by patient    HPI Gina Robinson is a 61 y.o. female.   Patient presents for a bug bite to her right thigh that occurred over the weekend when she was gardening.  Reports initially, it looks like there is a "chunk taken out of" her leg.  Now, the area has a white center.  She reports the area was very red Monday and yesterday, however it has since improved significantly.  She denies any fevers, nausea/vomiting, drainage.  Reports area is tender and warm.  She denies antibiotic use in the past 3 months.    Past Medical History:  Diagnosis Date   Hypertension     Patient Active Problem List   Diagnosis Date Noted   GENERALIZED ANXIETY DISORDER 08/02/2009   CAD 06/30/2009   BACK PAIN WITH RADICULOPATHY 06/30/2009   ABDOMINAL PAIN, GENERALIZED 06/30/2009   ACUTE CYSTITIS 06/24/2009   BACK PAIN 06/24/2009   Abdominal pain, other specified site 06/24/2009   INSOMNIA 11/24/2008   MENORRHAGIA 11/17/2008   OBESITY 12/17/2007   NICOTINE ADDICTION 12/17/2007   DEPRESSION 12/17/2007   ASTHMA, UNSPECIFIED, UNSPECIFIED STATUS 12/17/2007    Past Surgical History:  Procedure Laterality Date   ABDOMINAL HYSTERECTOMY      OB History   No obstetric history on file.      Home Medications    Prior to Admission medications   Medication Sig Start Date End Date Taking? Authorizing Provider  HYDROcodone-acetaminophen (NORCO) 10-325 MG tablet Take 1 tablet by mouth every 6 (six) hours as needed.   Yes [provider]  amLODipine (NORVASC) 5 MG tablet Take 5 mg by mouth daily. 06/23/19   [provider]  doxycycline (VIBRAMYCIN) 100 MG capsule Take 1 capsule (100 mg total) by mouth 2 (two) times daily for 7 days. 12/27/21 01/03/22  Valentino Nose, NP  hydrochlorothiazide (HYDRODIURIL) 25  MG tablet Take 25 mg by mouth daily. 06/23/19   [provider]    Family History History reviewed. No pertinent family history.  Social History Social History   Tobacco Use   Smoking status: Every Day    Packs/day: 1.00    Types: Cigarettes   Smokeless tobacco: Never  Substance Use Topics   Alcohol use: Not Currently   Drug use: Not Currently     Allergies   Penicillins   Review of Systems Review of Systems Per HPI  Physical Exam Triage Vital Signs ED Triage Vitals  Enc Vitals Group     BP 12/27/21 1054 (!) 144/88     Pulse Rate 12/27/21 1054 75     Resp 12/27/21 1054 18     Temp 12/27/21 1054 98.4 F (36.9 C)     Temp Source 12/27/21 1054 Oral     SpO2 12/27/21 1054 96 %     Weight --      Height --      Head Circumference --      Peak Flow --      Pain Score 12/27/21 1055 2     Pain Loc --      Pain Edu? --      Excl. in GC? --    No data found.  Updated Vital Signs BP (!) 144/88 (BP Location: Right Arm)   Pulse 75  Temp 98.4 F (36.9 C) (Oral)   Resp 18   SpO2 96%   Visual Acuity Right Eye Distance:   Left Eye Distance:   Bilateral Distance:    Right Eye Near:   Left Eye Near:    Bilateral Near:     Physical Exam Vitals and nursing note reviewed.  Constitutional:      General: She is not in acute distress.    Appearance: Normal appearance. She is not ill-appearing, toxic-appearing or diaphoretic.  Pulmonary:     Effort: Pulmonary effort is normal. No respiratory distress.  Skin:    General: Skin is warm and dry.     Capillary Refill: Capillary refill takes less than 2 seconds.     Findings: Erythema present. No rash.          Comments: Circular, approximately 1 cm x 1 cm erythematous plaque with central white area.  Slightly fluctuant, warm, and tender.  No active drainage.  Neurological:     Mental Status: She is alert and oriented to person, place, and time.  Psychiatric:        Behavior: Behavior is cooperative.       UC Treatments / Results  Labs (all labs ordered are listed, but only abnormal results are displayed) Labs Reviewed - No data to display  EKG   Radiology No results found.  Procedures Procedures (including critical care time)  Medications Ordered in UC Medications - No data to display  Initial Impression / Assessment and Plan / UC Course  I have reviewed the triage vital signs and the nursing notes.  Pertinent labs & imaging results that were available during my care of the patient were reviewed by me and considered in my medical decision making (see chart for details).    Patient is a very pleasant, well-appearing 61 year old female presenting for insect bite of the right thigh.  We discussed options including incision and drainage with oral antibiotics or trying oral antibiotics on their own to help treat infection around the bite.  Patient elected oral antibiotics and reported she will return if her symptoms worsen despite treatment.  Encouraged use of warm compresses to help draw infection.  The patient was given the opportunity to ask questions.  All questions answered to their satisfaction.  The patient is in agreement to this plan.   Final Clinical Impressions(s) / UC Diagnoses   Final diagnoses:  Insect bite of right thigh, initial encounter     Discharge Instructions      - Please take the doxycycline twice daily for 7 days to treat any infection around or in the bite - If the white area gets bigger, redness spreads, please return to urgent care -You can use warm compress to help draw out infection    ED Prescriptions     Medication Sig Dispense Auth. Provider   doxycycline (VIBRAMYCIN) 100 MG capsule Take 1 capsule (100 mg total) by mouth 2 (two) times daily for 7 days. 14 capsule Valentino Nose, NP      PDMP not reviewed this encounter.   Valentino Nose, NP 12/27/21 1243

## 2021-12-28 ENCOUNTER — Other Ambulatory Visit: Payer: Medicaid Other

## 2022-03-12 ENCOUNTER — Other Ambulatory Visit: Payer: Self-pay | Admitting: Family Medicine

## 2022-03-12 DIAGNOSIS — Z79899 Other long term (current) drug therapy: Secondary | ICD-10-CM | POA: Diagnosis not present

## 2022-03-12 DIAGNOSIS — M5416 Radiculopathy, lumbar region: Secondary | ICD-10-CM | POA: Diagnosis not present

## 2022-03-12 DIAGNOSIS — Z5181 Encounter for therapeutic drug level monitoring: Secondary | ICD-10-CM | POA: Diagnosis not present

## 2022-03-12 DIAGNOSIS — M62830 Muscle spasm of back: Secondary | ICD-10-CM | POA: Diagnosis not present

## 2022-03-12 DIAGNOSIS — M4180 Other forms of scoliosis, site unspecified: Secondary | ICD-10-CM | POA: Diagnosis not present

## 2022-04-06 DIAGNOSIS — Z23 Encounter for immunization: Secondary | ICD-10-CM | POA: Diagnosis not present

## 2022-04-06 DIAGNOSIS — Z6841 Body Mass Index (BMI) 40.0 and over, adult: Secondary | ICD-10-CM | POA: Diagnosis not present

## 2022-04-06 DIAGNOSIS — J449 Chronic obstructive pulmonary disease, unspecified: Secondary | ICD-10-CM | POA: Diagnosis not present

## 2022-04-06 DIAGNOSIS — R69 Illness, unspecified: Secondary | ICD-10-CM | POA: Diagnosis not present

## 2022-04-06 DIAGNOSIS — R7309 Other abnormal glucose: Secondary | ICD-10-CM | POA: Diagnosis not present

## 2022-04-06 DIAGNOSIS — I10 Essential (primary) hypertension: Secondary | ICD-10-CM | POA: Diagnosis not present

## 2022-04-06 DIAGNOSIS — E119 Type 2 diabetes mellitus without complications: Secondary | ICD-10-CM | POA: Diagnosis not present

## 2022-04-06 DIAGNOSIS — M48061 Spinal stenosis, lumbar region without neurogenic claudication: Secondary | ICD-10-CM | POA: Diagnosis not present

## 2022-04-24 DIAGNOSIS — M5416 Radiculopathy, lumbar region: Secondary | ICD-10-CM | POA: Diagnosis not present

## 2022-06-05 DIAGNOSIS — M48062 Spinal stenosis, lumbar region with neurogenic claudication: Secondary | ICD-10-CM | POA: Diagnosis not present

## 2022-06-05 DIAGNOSIS — M5416 Radiculopathy, lumbar region: Secondary | ICD-10-CM | POA: Diagnosis not present

## 2022-06-22 ENCOUNTER — Other Ambulatory Visit: Payer: Self-pay | Admitting: Physical Medicine and Rehabilitation

## 2022-06-22 DIAGNOSIS — M5416 Radiculopathy, lumbar region: Secondary | ICD-10-CM

## 2022-06-28 ENCOUNTER — Ambulatory Visit
Admission: RE | Admit: 2022-06-28 | Discharge: 2022-06-28 | Disposition: A | Discharge status: Home / Self Care | Payer: Medicaid Other | Source: Ambulatory Visit | Attending: Physical Medicine and Rehabilitation | Admitting: Physical Medicine and Rehabilitation

## 2022-06-28 DIAGNOSIS — M5416 Radiculopathy, lumbar region: Secondary | ICD-10-CM

## 2022-06-28 MED ORDER — METHYLPREDNISOLONE ACETATE 40 MG/ML INJ SUSP (RADIOLOG
80.0000 mg | Freq: Once | INTRAMUSCULAR | Status: AC
  Administered 2022-06-28: 80 mg via EPIDURAL

## 2022-06-28 MED ORDER — IOPAMIDOL (ISOVUE-M 200) INJECTION 41%
1.0000 mL | Freq: Once | INTRAMUSCULAR | Status: AC
  Administered 2022-06-28: 1 mL via EPIDURAL

## 2022-06-28 NOTE — Discharge Instructions (Signed)

## 2022-08-20 DIAGNOSIS — M5416 Radiculopathy, lumbar region: Secondary | ICD-10-CM | POA: Diagnosis not present

## 2022-08-20 DIAGNOSIS — M4180 Other forms of scoliosis, site unspecified: Secondary | ICD-10-CM | POA: Diagnosis not present

## 2022-08-20 DIAGNOSIS — M48062 Spinal stenosis, lumbar region with neurogenic claudication: Secondary | ICD-10-CM | POA: Diagnosis not present

## 2022-08-22 ENCOUNTER — Telehealth: Payer: Self-pay

## 2022-08-22 NOTE — Telephone Encounter (Signed)
Mychart msg sent

## 2022-09-15 ENCOUNTER — Telehealth: Payer: Medicare HMO | Admitting: Nurse Practitioner

## 2022-09-15 DIAGNOSIS — J00 Acute nasopharyngitis [common cold]: Secondary | ICD-10-CM | POA: Diagnosis not present

## 2022-09-15 MED ORDER — FLUTICASONE PROPIONATE 50 MCG/ACT NA SUSP: 2.0000 | Freq: Every day | NASAL | 6 refills | Status: DC

## 2022-09-15 MED ORDER — BENZONATATE 100 MG PO CAPS: 100.0000 mg | ORAL_CAPSULE | Freq: Three times a day (TID) | ORAL | 0 refills | Status: DC | PRN

## 2022-09-15 NOTE — Progress Notes (Signed)

## 2022-10-28 ENCOUNTER — Telehealth: Payer: Medicare HMO | Admitting: Nurse Practitioner

## 2022-10-28 DIAGNOSIS — J069 Acute upper respiratory infection, unspecified: Secondary | ICD-10-CM

## 2022-10-29 MED ORDER — IPRATROPIUM BROMIDE 0.03 % NA SOLN: 2.0000 | Freq: Two times a day (BID) | NASAL | 12 refills | Status: DC

## 2022-10-29 MED ORDER — BENZONATATE 100 MG PO CAPS: 100.0000 mg | ORAL_CAPSULE | Freq: Three times a day (TID) | ORAL | 0 refills | Status: DC | PRN

## 2022-10-29 NOTE — Progress Notes (Signed)
E-Visit for Upper Respiratory Infection   We are sorry you are not feeling well.  Here is how we plan to help!  Based on what you have shared with me, it looks like you may have a viral upper respiratory infection.  Upper respiratory infections are caused by a large number of viruses; however, rhinovirus is the most common cause.   Symptoms vary from person to person, with common symptoms including sore throat, cough, fatigue or lack of energy and feeling of general discomfort.  A low-grade fever of up to 100.4 may present, but is often uncommon.  Symptoms vary however, and are closely related to a person's age or underlying illnesses.  The most common symptoms associated with an upper respiratory infection are nasal discharge or congestion, cough, sneezing, headache and pressure in the ears and face.  These symptoms usually persist for about 3 to 10 days, but can last up to 2 weeks.  It is important to know that upper respiratory infections do not cause serious illness or complications in most cases.    Upper respiratory infections can be transmitted from person to person, with the most common method of transmission being a person's hands.  The virus is able to live on the skin and can infect other persons for up to 2 hours after direct contact.  Also, these can be transmitted when someone coughs or sneezes; thus, it is important to cover the mouth to reduce this risk.  To keep the spread of the illness at bay, good hand hygiene is very important.  This is an infection that is most likely caused by a virus. There are no specific treatments other than to help you with the symptoms until the infection runs its course.  We are sorry you are not feeling well.  Here is how we plan to help!   For nasal congestion, you may use an oral decongestants such as Mucinex D or if you have glaucoma or high blood pressure use plain Mucinex.  Saline nasal spray or nasal drops can help and can safely be used as often as  needed for congestion.  For your congestion, I have prescribed Ipratropium Bromide nasal spray 0.03% two sprays in each nostril 2-3 times a day  If you do not have a history of heart disease, hypertension, diabetes or thyroid disease, prostate/bladder issues or glaucoma, you may also use Sudafed to treat nasal congestion.  It is highly recommended that you consult with a pharmacist or your primary care physician to ensure this medication is safe for you to take.     If you have a cough, you may use cough suppressants such as Delsym and Robitussin.  If you have glaucoma or high blood pressure, you can also use Coricidin HBP.   For cough I have prescribed for you A prescription cough medication called Tessalon Perles 100 mg. You may take 1-2 capsules every 8 hours as needed for cough  If you have a sore or scratchy throat, use a saltwater gargle-  to  teaspoon of salt dissolved in a 4-ounce to 8-ounce glass of warm water.  Gargle the solution for approximately 15-30 seconds and then spit.  It is important not to swallow the solution.  You can also use throat lozenges/cough drops and Chloraseptic spray to help with throat pain or discomfort.  Warm or cold liquids can also be helpful in relieving throat pain.  For headache, pain or general discomfort, you can use Ibuprofen or Tylenol as directed.     Some authorities believe that zinc sprays or the use of Echinacea may shorten the course of your symptoms.   HOME CARE Only take medications as instructed by your medical team. Be sure to drink plenty of fluids. Water is fine as well as fruit juices, sodas and electrolyte beverages. You may want to stay away from caffeine or alcohol. If you are nauseated, try taking small sips of liquids. How do you know if you are getting enough fluid? Your urine should be a pale yellow or almost colorless. Get rest. Taking a steamy shower or using a humidifier may help nasal congestion and ease sore throat pain. You can  place a towel over your head and breathe in the steam from hot water coming from a faucet. Using a saline nasal spray works much the same way. Cough drops, hard candies and sore throat lozenges may ease your cough. Avoid close contacts especially the very young and the elderly Cover your mouth if you cough or sneeze Always remember to wash your hands.   GET HELP RIGHT AWAY IF: You develop worsening fever. If your symptoms do not improve within 10 days You develop yellow or green discharge from your nose over 3 days. You have coughing fits You develop a severe head ache or visual changes. You develop shortness of breath, difficulty breathing or start having chest pain Your symptoms persist after you have completed your treatment plan  MAKE SURE YOU  Understand these instructions. Will watch your condition. Will get help right away if you are not doing well or get worse.  Thank you for choosing an e-visit.  Your e-visit answers were reviewed by a board certified advanced clinical practitioner to complete your personal care plan. Depending upon the condition, your plan could have included both over the counter or prescription medications.  Please review your pharmacy choice. Make sure the pharmacy is open so you can pick up prescription now. If there is a problem, you may contact your provider through MyChart messaging and have the prescription routed to another pharmacy.  Your safety is important to us. If you have drug allergies check your prescription carefully.   For the next 24 hours you can use MyChart to ask questions about today's visit, request a non-urgent call back, or ask for a work or school excuse. You will get an email in the next two days asking about your experience. I hope that your e-visit has been valuable and will speed your recovery.  Meds ordered this encounter  Medications   benzonatate (TESSALON) 100 MG capsule    Sig: Take 1 capsule (100 mg total) by mouth 3  (three) times daily as needed.    Dispense:  30 capsule    Refill:  0   ipratropium (ATROVENT) 0.03 % nasal spray    Sig: Place 2 sprays into both nostrils every 12 (twelve) hours.    Dispense:  30 mL    Refill:  12    I spent approximately 5 minutes reviewing the patient's history, current symptoms and coordinating their care today.   

## 2022-11-03 ENCOUNTER — Telehealth: Payer: Medicare HMO | Admitting: Family

## 2022-11-03 DIAGNOSIS — J209 Acute bronchitis, unspecified: Secondary | ICD-10-CM

## 2022-11-03 MED ORDER — PREDNISONE 10 MG (21) PO TBPK: ORAL_TABLET | ORAL | 0 refills | Status: DC

## 2022-11-03 MED ORDER — BENZONATATE 100 MG PO CAPS: 100.0000 mg | ORAL_CAPSULE | Freq: Three times a day (TID) | ORAL | 0 refills | Status: DC | PRN

## 2022-11-03 NOTE — Progress Notes (Signed)
E-Visit for Cough  We are sorry that you are not feeling well.  Here is how we plan to help!  Based on your presentation I believe you most likely have A cough due to a virus.  This is called viral bronchitis and is best treated by rest, plenty of fluids and control of the cough.  You may use Ibuprofen or Tylenol as directed to help your symptoms.     In addition you may use A non-prescription cough medication called Robitussin DAC. Take 2 teaspoons every 8 hours or Delsym: take 2 teaspoons every 12 hours., A non-prescription cough medication called Mucinex DM: take 2 tablets every 12 hours., and A prescription cough medication called Tessalon Perles 100mg . You may take 1-2 capsules every 8 hours as needed for your cough.  Prednisone 10 mg daily for 6 days (see taper instructions below)  Directions for 6 day taper: Day 1: 2 tablets before breakfast, 1 after both lunch & dinner and 2 at bedtime Day 2: 1 tab before breakfast, 1 after both lunch & dinner and 2 at bedtime Day 3: 1 tab at each meal & 1 at bedtime Day 4: 1 tab at breakfast, 1 at lunch, 1 at bedtime Day 5: 1 tab at breakfast & 1 tab at bedtime Day 6: 1 tab at breakfast  From your responses in the eVisit questionnaire you describe inflammation in the upper respiratory tract which is causing a significant cough.  This is commonly called Bronchitis and has four common causes:   Allergies Viral Infections Acid Reflux Bacterial Infection Allergies, viruses and acid reflux are treated by controlling symptoms or eliminating the cause. An example might be a cough caused by taking certain blood pressure medications. You stop the cough by changing the medication. Another example might be a cough caused by acid reflux. Controlling the reflux helps control the cough.  USE OF BRONCHODILATOR ("RESCUE") INHALERS: There is a risk from using your bronchodilator too frequently.  The risk is that over-reliance on a medication which only relaxes the  muscles surrounding the breathing tubes can reduce the effectiveness of medications prescribed to reduce swelling and congestion of the tubes themselves.  Although you feel brief relief from the bronchodilator inhaler, your asthma may actually be worsening with the tubes becoming more swollen and filled with mucus.  This can delay other crucial treatments, such as oral steroid medications. If you need to use a bronchodilator inhaler daily, several times per day, you should discuss this with your provider.  There are probably better treatments that could be used to keep your asthma under control.     HOME CARE Only take medications as instructed by your medical team. Complete the entire course of an antibiotic. Drink plenty of fluids and get plenty of rest. Avoid close contacts especially the very young and the elderly Cover your mouth if you cough or cough into your sleeve. Always remember to wash your hands A steam or ultrasonic humidifier can help congestion.   GET HELP RIGHT AWAY IF: You develop worsening fever. You become short of breath You cough up blood. Your symptoms persist after you have completed your treatment plan MAKE SURE YOU  Understand these instructions. Will watch your condition. Will get help right away if you are not doing well or get worse.    Thank you for choosing an e-visit.  Your e-visit answers were reviewed by a board certified advanced clinical practitioner to complete your personal care plan. Depending upon the condition, your plan  could have included both over the counter or prescription medications.  Please review your pharmacy choice. Make sure the pharmacy is open so you can pick up prescription now. If there is a problem, you may contact your provider through [employer] and have the prescription routed to another pharmacy.  Your safety is important to [place]. If you have drug allergies check your prescription carefully.   For the next 24 hours you can  use MyChart to ask questions about today's visit, request a non-urgent call back, or ask for a work or school excuse. You will get an email in the next two days asking about your experience. I hope that your e-visit has been valuable and will speed your recovery.  Approximately 5 minutes was spent documenting and reviewing patient's chart.

## 2022-11-14 ENCOUNTER — Telehealth: Payer: Medicare HMO | Admitting: Nurse Practitioner

## 2022-11-14 DIAGNOSIS — H00014 Hordeolum externum left upper eyelid: Secondary | ICD-10-CM | POA: Diagnosis not present

## 2022-11-14 MED ORDER — BACITRACIN-POLYMYXIN B 500-10000 UNIT/GM OP OINT: 1.0000 | TOPICAL_OINTMENT | Freq: Two times a day (BID) | OPHTHALMIC | 0 refills | Status: AC

## 2022-11-14 NOTE — Progress Notes (Signed)
  E-Visit for Stye   We are sorry that you are not feeling well. Here is how we plan to help!  Based on what you have shared with me it looks like you have a stye.  A stye is an inflammation of the eyelid.  It is often a red, painful lump near the edge of the eyelid that may look like a boil or a pimple.  A stye develops when an infection occurs at the base of an eyelash.   We have made appropriate suggestions for you based upon your presentation: Your symptoms may indicate an infection of the sclera.  We have called in an ointment to use in and around your eye. This ointment is safe for your eye.   If you can apply a warm compress prior to applying the ointment, and then apply the ointment along the eyelashes in the area of the stye. It is OK for the ointment to get in our eye as well. Continue this 2-3 times a day until you have relief.   Meds ordered this encounter  Medications   bacitracin-polymyxin b (POLYSPORIN) ophthalmic ointment    Sig: Place 1 Application into the left eye 2 (two) times daily for 5 days. Apply warm compress prior and then ointment along eyelashes where stye is located    Dispense:  3.5 g    Refill:  0     HOME CARE:  Wash your hands often! Let the stye open on its own. Don't squeeze or open it. Don't rub your eyes. This can irritate your eyes and let in bacteria.  If you need to touch your eyes, wash your hands first. Don't wear eye makeup or contact lenses until the area has healed.  GET HELP RIGHT AWAY IF:  Your symptoms do not improve. You develop blurred or loss of vision. Your symptoms worsen (increased discharge, pain or redness).   Thank you for choosing an e-visit.  Your e-visit answers were reviewed by a board certified advanced clinical practitioner to complete your personal care plan. Depending upon the condition, your plan could have included both over the counter or prescription medications.  Please review your pharmacy choice. Make sure  the pharmacy is open so you can pick up prescription now. If there is a problem, you may contact your provider through [employer] and have the prescription routed to another pharmacy.  Your safety is important to [place]. If you have drug allergies check your prescription carefully.   For the next 24 hours you can use MyChart to ask questions about today's visit, request a non-urgent call back, or ask for a work or school excuse. You will get an email in the next two days asking about your experience. I hope that your e-visit has been valuable and will speed your recovery.   I spent approximately 5 minutes reviewing the patient's history, current symptoms and coordinating their care today.

## 2022-12-26 DIAGNOSIS — M5459 Other low back pain: Secondary | ICD-10-CM | POA: Diagnosis not present

## 2022-12-26 DIAGNOSIS — M5416 Radiculopathy, lumbar region: Secondary | ICD-10-CM | POA: Diagnosis not present

## 2022-12-26 DIAGNOSIS — M48062 Spinal stenosis, lumbar region with neurogenic claudication: Secondary | ICD-10-CM | POA: Diagnosis not present

## 2022-12-26 DIAGNOSIS — M79644 Pain in right finger(s): Secondary | ICD-10-CM | POA: Diagnosis not present

## 2023-01-01 DIAGNOSIS — M5416 Radiculopathy, lumbar region: Secondary | ICD-10-CM | POA: Diagnosis not present

## 2023-01-13 ENCOUNTER — Telehealth: Payer: Medicare HMO | Admitting: Nurse Practitioner

## 2023-01-13 DIAGNOSIS — K047 Periapical abscess without sinus: Secondary | ICD-10-CM | POA: Diagnosis not present

## 2023-01-13 MED ORDER — CLINDAMYCIN HCL 300 MG PO CAPS: 300.0000 mg | ORAL_CAPSULE | Freq: Three times a day (TID) | ORAL | 0 refills | Status: AC

## 2023-01-13 NOTE — Progress Notes (Signed)
I have spent 5 minutes in review of e-visit questionnaire, review and updating patient chart, medical decision making and response to patient.  ° ° W , NP ° °  °

## 2023-01-13 NOTE — Progress Notes (Signed)
E-Visit for Dental Pain  We are sorry that you are not feeling well.  Here is how we plan to help!  Based on what you have shared with me in the questionnaire, it sounds like you have a dental infection. Eventually if you do not get your teeth taken care of the bacteria can spread to your heart.   Clindamycin 300mg  3 times a day for 7 days for your dental infection. I also recommend Listerine daily to help kill bacteria  It is imperative that you see a dentist within 10 days of this eVisit to determine the cause of the dental pain and be sure it is adequately treated  A toothache or tooth pain is caused when the nerve in the root of a tooth or surrounding a tooth is irritated. Dental (tooth) infection, decay, injury, or loss of a tooth are the most common causes of dental pain. Pain may also occur after an extraction (tooth is pulled out). Pain sometimes originates from other areas and radiates to the jaw, thus appearing to be tooth pain.Bacteria growing inside your mouth can contribute to gum disease and dental decay, both of which can cause pain. A toothache occurs from inflammation of the central portion of the tooth called pulp. The pulp contains nerve endings that are very sensitive to pain. Inflammation to the pulp or pulpitis may be caused by dental cavities, trauma, and infection.    HOME CARE:   For toothaches: Over-the-counter pain medications such as acetaminophen or ibuprofen may be used. Take these as directed on the package while you arrange for a dental appointment. Avoid very cold or hot foods, because they may make the pain worse. You may get relief from biting on a cotton ball soaked in oil of cloves. You can get oil of cloves at most drug stores.  For jaw pain:  Aspirin may be helpful for problems in the joint of the jaw in adults. If pain happens every time you open your mouth widely, the temporomandibular joint (TMJ) may be the source of the pain. Yawning or taking a large  bite of food may worsen the pain. An appointment with your doctor or dentist will help you find the cause.     GET HELP RIGHT AWAY IF:  You have a high fever or chills If you have had a recent head or face injury and develop headache, light headedness, nausea, vomiting, or other symptoms that concern you after an injury to your face or mouth, you could have a more serious injury in addition to your dental injury. A facial rash associated with a toothache: This condition may improve with medication. Contact your doctor for them to decide what is appropriate. Any jaw pain occurring with chest pain: Although jaw pain is most commonly caused by dental disease, it is sometimes referred pain from other areas. People with heart disease, especially people who have had stents placed, people with diabetes, or those who have had heart surgery may have jaw pain as a symptom of heart attack or angina. If your jaw or tooth pain is associated with lightheadedness, sweating, or shortness of breath, you should see a doctor as soon as possible. Trouble swallowing or excessive pain or bleeding from gums: If you have a history of a weakened immune system, diabetes, or steroid use, you may be more susceptible to infections. Infections can often be more severe and extensive or caused by unusual organisms. Dental and gum infections in people with these conditions may require more  aggressive treatment. An abscess may need draining or IV antibiotics, for example.  MAKE SURE YOU   Understand these instructions. Will watch your condition. Will get help right away if you are not doing well or get worse.  Thank you for choosing an e-visit.  Your e-visit answers were reviewed by a board certified advanced clinical practitioner to complete your personal care plan. Depending upon the condition, your plan could have included both over the counter or prescription medications.  Please review your pharmacy choice. Make sure the  pharmacy is open so you can pick up prescription now. If there is a problem, you may contact your provider through [employer] and have the prescription routed to another pharmacy.  Your safety is important to [place]. If you have drug allergies check your prescription carefully.   For the next 24 hours you can use MyChart to ask questions about today's visit, request a non-urgent call back, or ask for a work or school excuse. You will get an email in the next two days asking about your experience. I hope that your e-visit has been valuable and will speed your recovery.

## 2023-02-14 ENCOUNTER — Telehealth: Payer: Medicare HMO | Admitting: Physician Assistant

## 2023-02-14 DIAGNOSIS — K047 Periapical abscess without sinus: Secondary | ICD-10-CM

## 2023-02-14 MED ORDER — NAPROXEN 500 MG PO TABS: 500.0000 mg | ORAL_TABLET | Freq: Two times a day (BID) | ORAL | 0 refills | Status: DC

## 2023-02-14 MED ORDER — CLINDAMYCIN HCL 300 MG PO CAPS
300.0000 mg | ORAL_CAPSULE | Freq: Three times a day (TID) | ORAL | 0 refills | Status: AC
Start: 2023-02-14 — End: 2023-02-21

## 2023-02-14 NOTE — Progress Notes (Signed)
I have spent 5 minutes in review of e-visit questionnaire, review and updating patient chart, medical decision making and response to patient.   William Cody Martin, PA-C    

## 2023-02-14 NOTE — Progress Notes (Signed)

## 2023-03-31 DIAGNOSIS — M5416 Radiculopathy, lumbar region: Secondary | ICD-10-CM | POA: Diagnosis not present

## 2023-04-29 ENCOUNTER — Telehealth: Payer: Medicare HMO | Admitting: Physician Assistant

## 2023-04-29 DIAGNOSIS — J208 Acute bronchitis due to other specified organisms: Secondary | ICD-10-CM | POA: Diagnosis not present

## 2023-04-29 MED ORDER — PREDNISONE 20 MG PO TABS
40.0000 mg | ORAL_TABLET | Freq: Every day | ORAL | 0 refills | Status: DC
Start: 2023-04-29 — End: 2023-05-22

## 2023-04-29 MED ORDER — BENZONATATE 100 MG PO CAPS
100.0000 mg | ORAL_CAPSULE | Freq: Three times a day (TID) | ORAL | 0 refills | Status: DC | PRN
Start: 2023-04-29 — End: 2023-05-22

## 2023-04-29 NOTE — Progress Notes (Signed)
E-Visit for Cough   We are sorry that you are not feeling well.  Here is how we plan to help!  Based on your presentation I believe you most likely have A cough due to a virus.  This is called viral bronchitis and is best treated by rest, plenty of fluids and control of the cough.  You may use Ibuprofen or Tylenol as directed to help your symptoms.     In addition you may use A non-prescription cough medication called Mucinex DM: take 2 tablets every 12 hours. and A prescription cough medication called Tessalon Perles 100mg . You may take 1-2 capsules every 8 hours as needed for your cough.  Prednisone 20mg  Take 2 tablets (40mg ) daily with food for 7 days  From your responses in the eVisit questionnaire you describe inflammation in the upper respiratory tract which is causing a significant cough.  This is commonly called Bronchitis and has four common causes:   Allergies Viral Infections Acid Reflux Bacterial Infection Allergies, viruses and acid reflux are treated by controlling symptoms or eliminating the cause. An example might be a cough caused by taking certain blood pressure medications. You stop the cough by changing the medication. Another example might be a cough caused by acid reflux. Controlling the reflux helps control the cough.  USE OF BRONCHODILATOR ("RESCUE") INHALERS: There is a risk from using your bronchodilator too frequently.  The risk is that over-reliance on a medication which only relaxes the muscles surrounding the breathing tubes can reduce the effectiveness of medications prescribed to reduce swelling and congestion of the tubes themselves.  Although you feel brief relief from the bronchodilator inhaler, your asthma may actually be worsening with the tubes becoming more swollen and filled with mucus.  This can delay other crucial treatments, such as oral steroid medications. If you need to use a bronchodilator inhaler daily, several times per day, you should discuss this  with your provider.  There are probably better treatments that could be used to keep your asthma under control.     HOME CARE Only take medications as instructed by your medical team. Complete the entire course of an antibiotic. Drink plenty of fluids and get plenty of rest. Avoid close contacts especially the very young and the elderly Cover your mouth if you cough or cough into your sleeve. Always remember to wash your hands A steam or ultrasonic humidifier can help congestion.   GET HELP RIGHT AWAY IF: You develop worsening fever. You become short of breath You cough up blood. Your symptoms persist after you have completed your treatment plan MAKE SURE YOU  Understand these instructions. Will watch your condition. Will get help right away if you are not doing well or get worse.    Thank you for choosing an e-visit.  Your e-visit answers were reviewed by a board certified advanced clinical practitioner to complete your personal care plan. Depending upon the condition, your plan could have included both over the counter or prescription medications.  Please review your pharmacy choice. Make sure the pharmacy is open so you can pick up prescription now. If there is a problem, you may contact your provider through [employer] and have the prescription routed to another pharmacy.  Your safety is important to [place]. If you have drug allergies check your prescription carefully.   For the next 24 hours you can use MyChart to ask questions about today's visit, request a non-urgent call back, or ask for a work or school excuse. You  will get an email in the next two days asking about your experience. I hope that your e-visit has been valuable and will speed your recovery.   I have spent 5 minutes in review of e-visit questionnaire, review and updating patient chart, medical decision making and response to patient.   Margaretann Loveless, PA-C

## 2023-05-05 ENCOUNTER — Telehealth: Payer: Medicare HMO | Admitting: Nurse Practitioner

## 2023-05-05 DIAGNOSIS — J40 Bronchitis, not specified as acute or chronic: Secondary | ICD-10-CM

## 2023-05-05 MED ORDER — AZITHROMYCIN 250 MG PO TABS
ORAL_TABLET | ORAL | 0 refills | Status: AC
Start: 2023-05-05 — End: 2023-05-10

## 2023-05-05 MED ORDER — ALBUTEROL SULFATE HFA 108 (90 BASE) MCG/ACT IN AERS
2.0000 | INHALATION_SPRAY | Freq: Four times a day (QID) | RESPIRATORY_TRACT | 0 refills | Status: AC | PRN
Start: 2023-05-05 — End: ?

## 2023-05-05 NOTE — Progress Notes (Signed)
I have spent 5 minutes in review of e-visit questionnaire, review and updating patient chart, medical decision making and response to patient.  ° °Jerrell Mangel W Secilia Apps, NP ° °  °

## 2023-05-05 NOTE — Progress Notes (Signed)
E-Visit for Cough   We are sorry that you are not feeling well.  Here is how we plan to help!  Based on your presentation I believe you most likely have A cough due to bacteria.  When patients have a fever and a productive cough with a change in color or increased sputum production, we are concerned about bacterial bronchitis.  If left untreated it can progress to pneumonia.  If your symptoms do not improve with your treatment plan it is important that you contact your provider.   I have prescribed Azithromyin 250 mg: two tablets now and then one tablet daily for 4 additonal days    In addition I have sent in albuterol inhaler.  From your responses in the eVisit questionnaire you describe inflammation in the upper respiratory tract which is causing a significant cough.  This is commonly called Bronchitis and has four common causes:   Allergies Viral Infections Acid Reflux Bacterial Infection Allergies, viruses and acid reflux are treated by controlling symptoms or eliminating the cause. An example might be a cough caused by taking certain blood pressure medications. You stop the cough by changing the medication. Another example might be a cough caused by acid reflux. Controlling the reflux helps control the cough.  USE OF BRONCHODILATOR ("RESCUE") INHALERS: There is a risk from using your bronchodilator too frequently.  The risk is that over-reliance on a medication which only relaxes the muscles surrounding the breathing tubes can reduce the effectiveness of medications prescribed to reduce swelling and congestion of the tubes themselves.  Although you feel brief relief from the bronchodilator inhaler, your asthma may actually be worsening with the tubes becoming more swollen and filled with mucus.  This can delay other crucial treatments, such as oral steroid medications. If you need to use a bronchodilator inhaler daily, several times per day, you should discuss this with your provider.  There  are probably better treatments that could be used to keep your asthma under control.     HOME CARE Only take medications as instructed by your medical team. Complete the entire course of an antibiotic. Drink plenty of fluids and get plenty of rest. Avoid close contacts especially the very young and the elderly Cover your mouth if you cough or cough into your sleeve. Always remember to wash your hands A steam or ultrasonic humidifier can help congestion.   GET HELP RIGHT AWAY IF: You develop worsening fever. You become short of breath You cough up blood. Your symptoms persist after you have completed your treatment plan MAKE SURE YOU  Understand these instructions. Will watch your condition. Will get help right away if you are not doing well or get worse.    Thank you for choosing an e-visit.  Your e-visit answers were reviewed by a board certified advanced clinical practitioner to complete your personal care plan. Depending upon the condition, your plan could have included both over the counter or prescription medications.  Please review your pharmacy choice. Make sure the pharmacy is open so you can pick up prescription now. If there is a problem, you may contact your provider through [employer] and have the prescription routed to another pharmacy.  Your safety is important to [place]. If you have drug allergies check your prescription carefully.   For the next 24 hours you can use MyChart to ask questions about today's visit, request a non-urgent call back, or ask for a work or school excuse. You will get an email in the next  two days asking about your experience. I hope that your e-visit has been valuable and will speed your recovery.

## 2023-05-22 ENCOUNTER — Ambulatory Visit: Payer: Medicare HMO

## 2023-05-22 ENCOUNTER — Ambulatory Visit
Admission: RE | Admit: 2023-05-22 | Discharge: 2023-05-22 | Disposition: A | Discharge status: Home / Self Care | Payer: Medicare HMO | Source: Ambulatory Visit

## 2023-05-22 ENCOUNTER — Other Ambulatory Visit: Payer: Self-pay

## 2023-05-22 VITALS — BP 148/84 | HR 72 | Temp 98.1°F | Resp 20

## 2023-05-22 DIAGNOSIS — J208 Acute bronchitis due to other specified organisms: Secondary | ICD-10-CM

## 2023-05-22 DIAGNOSIS — R0602 Shortness of breath: Secondary | ICD-10-CM | POA: Diagnosis not present

## 2023-05-22 DIAGNOSIS — R059 Cough, unspecified: Secondary | ICD-10-CM | POA: Diagnosis not present

## 2023-05-22 DIAGNOSIS — J441 Chronic obstructive pulmonary disease with (acute) exacerbation: Secondary | ICD-10-CM

## 2023-05-22 HISTORY — DX: Spinal stenosis, site unspecified: M48.00

## 2023-05-22 HISTORY — DX: Other intervertebral disc degeneration, lumbar region without mention of lumbar back pain or lower extremity pain: M51.369

## 2023-05-22 HISTORY — DX: Chronic obstructive pulmonary disease, unspecified: J44.9

## 2023-05-22 MED ORDER — IPRATROPIUM-ALBUTEROL 0.5-2.5 (3) MG/3ML IN SOLN
3.0000 mL | Freq: Once | RESPIRATORY_TRACT | Status: AC
  Administered 2023-05-22: 3 mL via RESPIRATORY_TRACT

## 2023-05-22 MED ORDER — BENZONATATE 100 MG PO CAPS: 100.0000 mg | ORAL_CAPSULE | Freq: Three times a day (TID) | ORAL | 0 refills | Status: AC | PRN

## 2023-05-22 MED ORDER — IPRATROPIUM-ALBUTEROL 0.5-2.5 (3) MG/3ML IN SOLN: 3.0000 mL | Freq: Four times a day (QID) | RESPIRATORY_TRACT | 0 refills | Status: AC | PRN

## 2023-05-22 NOTE — ED Provider Notes (Signed)
[facility]    CSN: [id number] Arrival date & time: 05/22/23  0945      History   Chief Complaint Chief Complaint  Patient presents with   Cough    I was just treated for bronchitis within the last two weeks.  Was starting to feel better and got another"cold". Just want to make sure I'm not getting pneumonia.  Been sick about 3 days this time. - Entered by patient    HPI Gina Robinson is a 62 y.o. female.   Patient presents today with 3-day history of coughing up clear mucus, shortness of breath, runny and stuffy nose, sore throat, headache, decreased appetite, and fatigue.  She denies fever, body aches or chills, no chest pain or tightness, ear pain, abdominal pain, nausea/vomiting, and diarrhea.  Has been around her grandchildren have been sick with similar symptoms.  Has not taking Mucinex and using albuterol nebulizer or inhaler in the morning and at night without much benefit.  Reports history of COPD, on [place].  Patient reports approximately 3 weeks ago, she had bronchitis and was treated with steroids and an antibiotic.  Reports symptoms improved for about a week, then returned.  Is concerned for pneumonia today.    Past Medical History:  Diagnosis Date   COPD (chronic obstructive pulmonary disease) (HCC)    Degenerative disc disease, lumbar    Hypertension    Spinal stenosis     Patient Active Problem List   Diagnosis Date Noted   GENERALIZED ANXIETY DISORDER 08/02/2009   Coronary atherosclerosis 06/30/2009   BACK PAIN WITH RADICULOPATHY 06/30/2009   ABDOMINAL PAIN, GENERALIZED 06/30/2009   ACUTE CYSTITIS 06/24/2009   Backache 06/24/2009   Abdominal pain, other specified site 06/24/2009   INSOMNIA 11/24/2008   MENORRHAGIA 11/17/2008   OBESITY 12/17/2007   NICOTINE ADDICTION 12/17/2007   DEPRESSION 12/17/2007   Asthma 12/17/2007    Past Surgical History:  Procedure Laterality Date   ABDOMINAL HYSTERECTOMY      OB History   No obstetric  history on file.      Home Medications    Prior to Admission medications   Medication Sig Start Date End Date Taking? Authorizing Provider  fluticasone-salmeterol (ADVAIR) 250-50 MCG/ACT AEPB Inhale 1 puff into the lungs in the morning and at bedtime.   Yes [provider]  HYDROcodone-acetaminophen (NORCO/VICODIN) 5-325 MG tablet Take 1 tablet every 6 hours by oral route as needed. 08/20/22  Yes [provider]  ipratropium-albuterol (DUONEB) 0.5-2.5 (3) MG/3ML SOLN Take 3 mLs by nebulization every 6 (six) hours as needed. 05/22/23  Yes Valentino Nose, NP  albuterol (VENTOLIN HFA) 108 (90 Base) MCG/ACT inhaler Inhale 2 puffs into the lungs every 6 (six) hours as needed for wheezing or shortness of breath. 05/05/23   Claiborne Rigg, NP  amLODipine (NORVASC) 5 MG tablet Take 5 mg by mouth daily. 06/23/19   [provider]  atorvastatin (LIPITOR) 10 MG tablet Take 10 mg by mouth at bedtime.    [provider]  benzonatate (TESSALON) 100 MG capsule Take 1-2 capsules (100-200 mg total) by mouth 3 (three) times daily as needed. 05/22/23   Valentino Nose, NP  hydrochlorothiazide (HYDRODIURIL) 25 MG tablet Take 25 mg by mouth daily. 06/23/19   [provider]    Family History History reviewed. No pertinent family history.  Social History Social History   Tobacco Use   Smoking status: Every Day    Current packs/day: 1.00    Types:  Cigarettes   Smokeless tobacco: Never  Substance Use Topics   Alcohol use: Not Currently   Drug use: Not Currently     Allergies   Penicillins   Review of Systems Review of Systems Per HPI  Physical Exam Triage Vital Signs ED Triage Vitals  Encounter Vitals Group     BP 05/22/23 1022 (!) 148/84     Systolic BP Percentile --      Diastolic BP Percentile --      Pulse Rate 05/22/23 1022 72     Resp 05/22/23 1022 20     Temp 05/22/23 1022 98.1 F (36.7 C)     Temp Source 05/22/23 1022 Oral      SpO2 05/22/23 1022 94 %     Weight --      Height --      Head Circumference --      Peak Flow --      Pain Score 05/22/23 1017 0     Pain Loc --      Pain Education --      Exclude from Growth Chart --    No data found.  Updated Vital Signs BP (!) 148/84 (BP Location: Right Arm)   Pulse 72   Temp 98.1 F (36.7 C) (Oral)   Resp 20   SpO2 94%   Visual Acuity Right Eye Distance:   Left Eye Distance:   Bilateral Distance:    Right Eye Near:   Left Eye Near:    Bilateral Near:     Physical Exam Vitals and nursing note reviewed.  Constitutional:      General: She is not in acute distress.    Appearance: Normal appearance. She is not ill-appearing or toxic-appearing.  HENT:     Head: Normocephalic and atraumatic.     Right Ear: Tympanic membrane, ear canal and external ear normal.     Left Ear: Tympanic membrane, ear canal and external ear normal.     Nose: Congestion present. No rhinorrhea.     Mouth/Throat:     Mouth: Mucous membranes are moist.     Pharynx: Oropharynx is clear. No oropharyngeal exudate or posterior oropharyngeal erythema.  Eyes:     General: No scleral icterus.    Extraocular Movements: Extraocular movements intact.  Cardiovascular:     Rate and Rhythm: Normal rate and regular rhythm.  Pulmonary:     Effort: Pulmonary effort is normal. No respiratory distress.     Breath sounds: Wheezing present. No rhonchi or rales.  Musculoskeletal:     Cervical back: Normal range of motion and neck supple.  Lymphadenopathy:     Cervical: No cervical adenopathy.  Skin:    General: Skin is warm and dry.     Coloration: Skin is not jaundiced or pale.     Findings: No erythema or rash.  Neurological:     Mental Status: She is alert and oriented to person, place, and time.  Psychiatric:        Behavior: Behavior is cooperative.      UC Treatments / Results  Labs (all labs ordered are listed, but only abnormal results are displayed) Labs Reviewed - No  data to display  EKG   Radiology DG Chest 2 View  Result Date: 05/22/2023 CLINICAL DATA:  Cough and shortness of breath for 1 month. EXAM: CHEST - 2 VIEW COMPARISON:  Chest radiographs 11/12/2020 in 04/16/2020 FINDINGS: Cardiac silhouette and mediastinal contours are within limits. There is flattening of the diaphragms and  moderate to high-grade hyperinflation, unchanged. Mild bilateral lower lung interstitial thickening is unchanged from prior and chronic. No acute airspace opacity. Mild-to-moderate multilevel degenerative disc changes of the thoracic spine. IMPRESSION: 1. No acute cardiopulmonary process. 2. Moderate to high-grade hyperinflation, unchanged. Electronically Signed   By: Neita Garnet M.D.   On: 05/22/2023 10:57    Procedures Procedures (including critical care time)  Medications Ordered in UC Medications  ipratropium-albuterol (DUONEB) 0.5-2.5 (3) MG/3ML nebulizer solution 3 mL (3 mLs Nebulization Given 05/22/23 1112)    Initial Impression / Assessment and Plan / UC Course  I have reviewed the triage vital signs and the nursing notes.  Pertinent labs & imaging results that were available during my care of the patient were reviewed by me and considered in my medical decision making (see chart for details).   Patient is well-appearing, normotensive, afebrile, not tachycardic, not tachypneic, oxygenating well on room air.    1. COPD exacerbation (HCC) Overall, vitals and exam are reassuring Suspect viral etiology Since she was treated in the past month for COPD exacerbation with likely azithromycin,, mucus nonpurulent, will not prescribe antibiotics at this time Chest x-ray imaging shows no acute cardiopulmonary process DuoNeb given with resolution of wheezing, patient reported subjective improvement  Start DuoNebs at home in place of regular albuterol or albuterol inhaler Other supportive care discussed including continue Mucinex, stop Afrin Return and ER precautions  discussed  The patient was given the opportunity to ask questions.  All questions answered to their satisfaction.  The patient is in agreement to this plan.    Final Clinical Impressions(s) / UC Diagnoses   Final diagnoses:  COPD exacerbation [facility])     Discharge Instructions      We gave you a DuoNeb today which helped improve your breathing all the way.  Continue to use a DuoNeb or albuterol inhaler every 4-6 hours as needed for wheezing or shortness of breath.  Use this in place of the plain albuterol nebulizer medicine.  Continue Mucinex 600 mg twice daily, stop Afrin.  Make sure you are drinking plenty of fluids.  Seek care if symptoms do not improve with treatment.  I sent over some cough suppressant medication to the pharmacy for you as well.     ED Prescriptions     Medication Sig Dispense Auth. Provider   benzonatate (TESSALON) 100 MG capsule Take 1-2 capsules (100-200 mg total) by mouth 3 (three) times daily as needed. 30 capsule Cathlean Marseilles A, NP   ipratropium-albuterol (DUONEB) 0.5-2.5 (3) MG/3ML SOLN Take 3 mLs by nebulization every 6 (six) hours as needed. 360 mL Valentino Nose, NP      PDMP not reviewed this encounter.   Valentino Nose, NP 05/22/23 1233

## 2023-05-22 NOTE — Discharge Instructions (Addendum)
We gave you a DuoNeb today which helped improve your breathing all the way.  Continue to use a DuoNeb or albuterol inhaler every 4-6 hours as needed for wheezing or shortness of breath.  Use this in place of the plain albuterol nebulizer medicine.  Continue Mucinex 600 mg twice daily, stop Afrin.  Make sure you are drinking plenty of fluids.  Seek care if symptoms do not improve with treatment.  I sent over some cough suppressant medication to the pharmacy for you as well.

## 2023-05-22 NOTE — ED Triage Notes (Signed)
Pt reports hx of copd, has had cough for awhile with some improvement of symptoms while being treated for bronchitis. reports cough returned on Monday and is concerned for pneumonia. Reports increased shortness of breath with exertion last few days.

## 2023-05-24 ENCOUNTER — Telehealth: Payer: Medicare HMO | Admitting: Physician Assistant

## 2023-05-24 DIAGNOSIS — J019 Acute sinusitis, unspecified: Secondary | ICD-10-CM

## 2023-05-24 DIAGNOSIS — J441 Chronic obstructive pulmonary disease with (acute) exacerbation: Secondary | ICD-10-CM

## 2023-05-24 DIAGNOSIS — B9689 Other specified bacterial agents as the cause of diseases classified elsewhere: Secondary | ICD-10-CM | POA: Diagnosis not present

## 2023-05-24 MED ORDER — DOXYCYCLINE HYCLATE 100 MG PO TABS: 100.0000 mg | ORAL_TABLET | Freq: Two times a day (BID) | ORAL | 0 refills | Status: AC

## 2023-05-24 MED ORDER — PREDNISONE 10 MG PO TABS: ORAL_TABLET | ORAL | 0 refills | Status: AC

## 2023-05-24 NOTE — Addendum Note (Signed)
Addended by: Margaretann Loveless on: 05/24/2023 03:07 PM   Modules accepted: Orders

## 2023-05-24 NOTE — Progress Notes (Signed)

## 2023-05-31 ENCOUNTER — Ambulatory Visit: Payer: Medicare HMO

## 2023-06-06 DIAGNOSIS — E1159 Type 2 diabetes mellitus with other circulatory complications: Secondary | ICD-10-CM | POA: Diagnosis not present

## 2023-06-06 DIAGNOSIS — Z0001 Encounter for general adult medical examination with abnormal findings: Secondary | ICD-10-CM | POA: Diagnosis not present

## 2023-07-02 DIAGNOSIS — Z87891 Personal history of nicotine dependence: Secondary | ICD-10-CM | POA: Diagnosis not present

## 2023-07-02 DIAGNOSIS — Z5181 Encounter for therapeutic drug level monitoring: Secondary | ICD-10-CM | POA: Diagnosis not present

## 2023-07-02 DIAGNOSIS — Z79899 Other long term (current) drug therapy: Secondary | ICD-10-CM | POA: Diagnosis not present

## 2023-07-29 DIAGNOSIS — M5416 Radiculopathy, lumbar region: Secondary | ICD-10-CM | POA: Diagnosis not present

## 2023-07-29 DIAGNOSIS — M48062 Spinal stenosis, lumbar region with neurogenic claudication: Secondary | ICD-10-CM | POA: Diagnosis not present

## 2023-11-20 DIAGNOSIS — M48062 Spinal stenosis, lumbar region with neurogenic claudication: Secondary | ICD-10-CM | POA: Diagnosis not present

## 2023-11-20 DIAGNOSIS — M5416 Radiculopathy, lumbar region: Secondary | ICD-10-CM | POA: Diagnosis not present

## 2023-12-04 ENCOUNTER — Telehealth: Admitting: Physician Assistant

## 2023-12-04 DIAGNOSIS — R21 Rash and other nonspecific skin eruption: Secondary | ICD-10-CM

## 2023-12-04 MED ORDER — CEPHALEXIN 500 MG PO CAPS: 500.0000 mg | ORAL_CAPSULE | Freq: Four times a day (QID) | ORAL | 0 refills | Status: AC

## 2023-12-04 MED ORDER — PREDNISONE 10 MG (21) PO TBPK: ORAL_TABLET | Freq: Every day | ORAL | 0 refills | Status: AC

## 2023-12-04 NOTE — Progress Notes (Signed)
 E Visit for Rash  We are sorry that you are not feeling well. Here is how we plan to help!  Based on what you shared with me it looks like you have a rash related to an allergic response. There is also some concern you may be developing a secondary infection.  Your skin may be red, swollen, dry, cracked, and itch.  The rash should go away in a few days but can last a few weeks. You can continue to use the topical steroid. I have also prescribed a steroid taper to take by mouth called Prednisone  and an antibiotic called Keflex.   HOME CARE:  Take cool showers and avoid direct sunlight. Apply cool compress or wet dressings. Take a bath in an oatmeal bath.  Sprinkle content of one Aveeno packet under running faucet with comfortably warm water.  Bathe for 15-20 minutes, 1-2 times daily.  Pat dry with a towel. Do not rub the rash. Use hydrocortisone cream. Take an antihistamine like Benadryl for widespread rashes that itch.  The adult dose of Benadryl is 25-50 mg by mouth 4 times daily. Caution:  This type of medication may cause sleepiness.  Do not drink alcohol, drive, or operate dangerous machinery while taking antihistamines.  Do not take these medications if you have prostate enlargement.  Read package instructions thoroughly on all medications that you take.  GET HELP RIGHT AWAY IF:  Symptoms don't go away after treatment. Severe itching that persists. If you rash spreads or swells. If you rash begins to smell. If it blisters and opens or develops a yellow-brown crust. You develop a fever. You have a sore throat. You become short of breath.  MAKE SURE YOU:  Understand these instructions. Will watch your condition. Will get help right away if you are not doing well or get worse.  Thank you for choosing an e-visit.  Your e-visit answers were reviewed by a board certified advanced clinical practitioner to complete your personal care plan. Depending upon the condition, your plan could  have included both over the counter or prescription medications.  Please review your pharmacy choice. Make sure the pharmacy is open so you can pick up prescription now. If there is a problem, you may contact your provider through [employer] and have the prescription routed to another pharmacy.  Your safety is important to us . If you have drug allergies check your prescription carefully.   For the next 24 hours you can use MyChart to ask questions about today's visit, request a non-urgent call back, or ask for a work or school excuse. You will get an email in the next two days asking about your experience. I hope that your e-visit has been valuable and will speed your recovery.  Approximately 5 minutes was spent documenting and reviewing patient's chart.

## 2024-03-17 DIAGNOSIS — Z5181 Encounter for therapeutic drug level monitoring: Secondary | ICD-10-CM | POA: Diagnosis not present

## 2024-03-17 DIAGNOSIS — M5416 Radiculopathy, lumbar region: Secondary | ICD-10-CM | POA: Diagnosis not present

## 2024-03-17 DIAGNOSIS — M62830 Muscle spasm of back: Secondary | ICD-10-CM | POA: Diagnosis not present

## 2024-03-17 DIAGNOSIS — M48062 Spinal stenosis, lumbar region with neurogenic claudication: Secondary | ICD-10-CM | POA: Diagnosis not present

## 2024-03-24 DIAGNOSIS — Z1212 Encounter for screening for malignant neoplasm of rectum: Secondary | ICD-10-CM | POA: Diagnosis not present

## 2024-03-24 DIAGNOSIS — Z1211 Encounter for screening for malignant neoplasm of colon: Secondary | ICD-10-CM | POA: Diagnosis not present

## 2024-03-30 LAB — COLOGUARD: COLOGUARD: POSITIVE — AB

## 2024-06-17 ENCOUNTER — Other Ambulatory Visit: Payer: Self-pay

## 2024-06-17 NOTE — Telephone Encounter (Unsigned)
 Copied from CRM [id number]. Topic: Clinical - Medication Refill >> Jun 17, 2024  1:54 PM Gina Robinson wrote: Medication: fluticasone -salmeterol (ADVAIR) 250-50 MCG/ACT AEPB  Patient has new patient appnt march   Has the patient contacted their pharmacy? Yes (Agent: If no, request that the patient contact the pharmacy for the refill. If patient does not wish to contact the pharmacy document the reason why and proceed with request.) (Agent: If yes, when and what did the pharmacy advise?)  This is the patient's preferred pharmacy:  [id number] - [place], [place] - [address] AT [facility] [address] [place] [address] Phone: [phone] Fax: [phone]  Is this the correct pharmacy for this prescription? Yes If no, delete pharmacy and type the correct one.   Has the prescription been filled recently? Yes  Is the patient out of the medication? No  Has the patient been seen for an appointment in the last year OR does the patient have an upcoming appointment? Yes  Can we respond through MyChart? Yes  Agent: Please be advised that Rx refills may take up to 3 business days. We ask that you follow-up with your pharmacy.

## 2024-06-22 ENCOUNTER — Ambulatory Visit: Payer: Self-pay

## 2024-06-22 NOTE — Telephone Encounter (Signed)
" °  Reason for Disposition  General information question, no triage required and triager able to answer question  Answer Assessment - Initial Assessment Questions 1. REASON FOR CALL: What is the main reason for your call? or How can I best help you?  Pt states Lucie Mace at [facility] previously prescribed her generic Advair. Provided Pt with [facility] location/hours.  Protocols used: Information Only Call - No Triage-A-AH   Copied from CRM [id number]. Topic: Clinical - Medical Advice >> Jun 22, 2024 10:41 AM Roselie BROCKS wrote: Reason for CRM: Patient has a new patient appnt set up for march, and had a refill request put in for fluticasone -salmeterol (ADVAIR) 250-50 MCG/ACT AEPB ,and clinic stated she wasn't their patient. Patients  old pcp moved ,patient does not know where they moved. And needs inhaler refilled,  Patient requests a follow up. "

## 2024-07-15 ENCOUNTER — Encounter: Payer: Self-pay | Admitting: Gastroenterology

## 2024-08-10 ENCOUNTER — Ambulatory Visit

## 2024-08-18 ENCOUNTER — Ambulatory Visit: Admitting: Gastroenterology
